# Patient Record
Sex: Male | Born: 1945
Health system: Southern US, Community
[De-identification: ages and names within clinical notes are randomized; demographics above are authoritative.]

## PROBLEM LIST (undated history)

## (undated) DIAGNOSIS — I10 Essential (primary) hypertension: Secondary | ICD-10-CM

## (undated) DIAGNOSIS — E119 Type 2 diabetes mellitus without complications: Secondary | ICD-10-CM

## (undated) HISTORY — PX: NO PAST SURGERIES: SHX2092

---

## 2007-04-19 ENCOUNTER — Emergency Department (HOSPITAL_COMMUNITY): Admission: EM | Admit: 2007-04-19 | Discharge: 2007-04-19 | Payer: Self-pay | Admitting: Family Medicine

## 2007-08-05 ENCOUNTER — Emergency Department (HOSPITAL_COMMUNITY): Admission: EM | Admit: 2007-08-05 | Discharge: 2007-08-05 | Payer: Self-pay | Admitting: Emergency Medicine

## 2011-08-31 DIAGNOSIS — IMO0001 Reserved for inherently not codable concepts without codable children: Secondary | ICD-10-CM | POA: Diagnosis not present

## 2011-08-31 DIAGNOSIS — I10 Essential (primary) hypertension: Secondary | ICD-10-CM | POA: Diagnosis not present

## 2011-08-31 DIAGNOSIS — R634 Abnormal weight loss: Secondary | ICD-10-CM | POA: Diagnosis not present

## 2012-01-02 DIAGNOSIS — E119 Type 2 diabetes mellitus without complications: Secondary | ICD-10-CM | POA: Diagnosis not present

## 2012-01-03 ENCOUNTER — Encounter (INDEPENDENT_AMBULATORY_CARE_PROVIDER_SITE_OTHER): Payer: Medicare Other | Admitting: Ophthalmology

## 2012-01-03 DIAGNOSIS — I10 Essential (primary) hypertension: Secondary | ICD-10-CM

## 2012-01-03 DIAGNOSIS — H33309 Unspecified retinal break, unspecified eye: Secondary | ICD-10-CM | POA: Diagnosis not present

## 2012-01-03 DIAGNOSIS — E11319 Type 2 diabetes mellitus with unspecified diabetic retinopathy without macular edema: Secondary | ICD-10-CM

## 2012-01-03 DIAGNOSIS — H2510 Age-related nuclear cataract, unspecified eye: Secondary | ICD-10-CM

## 2012-01-03 DIAGNOSIS — E1165 Type 2 diabetes mellitus with hyperglycemia: Secondary | ICD-10-CM

## 2012-01-03 DIAGNOSIS — E1139 Type 2 diabetes mellitus with other diabetic ophthalmic complication: Secondary | ICD-10-CM | POA: Diagnosis not present

## 2012-01-03 DIAGNOSIS — H43819 Vitreous degeneration, unspecified eye: Secondary | ICD-10-CM

## 2012-01-03 DIAGNOSIS — H35039 Hypertensive retinopathy, unspecified eye: Secondary | ICD-10-CM

## 2012-01-18 ENCOUNTER — Ambulatory Visit (INDEPENDENT_AMBULATORY_CARE_PROVIDER_SITE_OTHER): Payer: Medicare Other | Admitting: Ophthalmology

## 2012-01-18 DIAGNOSIS — E11319 Type 2 diabetes mellitus with unspecified diabetic retinopathy without macular edema: Secondary | ICD-10-CM

## 2012-01-18 DIAGNOSIS — E1165 Type 2 diabetes mellitus with hyperglycemia: Secondary | ICD-10-CM | POA: Diagnosis not present

## 2012-01-18 DIAGNOSIS — H33309 Unspecified retinal break, unspecified eye: Secondary | ICD-10-CM | POA: Diagnosis not present

## 2012-01-18 DIAGNOSIS — E1139 Type 2 diabetes mellitus with other diabetic ophthalmic complication: Secondary | ICD-10-CM

## 2012-02-27 DIAGNOSIS — E7800 Pure hypercholesterolemia, unspecified: Secondary | ICD-10-CM | POA: Diagnosis not present

## 2012-02-27 DIAGNOSIS — IMO0001 Reserved for inherently not codable concepts without codable children: Secondary | ICD-10-CM | POA: Diagnosis not present

## 2012-03-03 DIAGNOSIS — Z23 Encounter for immunization: Secondary | ICD-10-CM | POA: Diagnosis not present

## 2012-03-03 DIAGNOSIS — I10 Essential (primary) hypertension: Secondary | ICD-10-CM | POA: Diagnosis not present

## 2012-03-03 DIAGNOSIS — E7800 Pure hypercholesterolemia, unspecified: Secondary | ICD-10-CM | POA: Diagnosis not present

## 2012-03-03 DIAGNOSIS — IMO0001 Reserved for inherently not codable concepts without codable children: Secondary | ICD-10-CM | POA: Diagnosis not present

## 2012-05-19 ENCOUNTER — Ambulatory Visit (INDEPENDENT_AMBULATORY_CARE_PROVIDER_SITE_OTHER): Payer: Medicare Other | Admitting: Ophthalmology

## 2012-05-29 ENCOUNTER — Ambulatory Visit (INDEPENDENT_AMBULATORY_CARE_PROVIDER_SITE_OTHER): Payer: Medicare Other | Admitting: Ophthalmology

## 2012-05-29 DIAGNOSIS — H43819 Vitreous degeneration, unspecified eye: Secondary | ICD-10-CM

## 2012-05-29 DIAGNOSIS — E1165 Type 2 diabetes mellitus with hyperglycemia: Secondary | ICD-10-CM

## 2012-05-29 DIAGNOSIS — E1139 Type 2 diabetes mellitus with other diabetic ophthalmic complication: Secondary | ICD-10-CM | POA: Diagnosis not present

## 2012-05-29 DIAGNOSIS — H2510 Age-related nuclear cataract, unspecified eye: Secondary | ICD-10-CM

## 2012-05-29 DIAGNOSIS — I10 Essential (primary) hypertension: Secondary | ICD-10-CM

## 2012-05-29 DIAGNOSIS — H33309 Unspecified retinal break, unspecified eye: Secondary | ICD-10-CM | POA: Diagnosis not present

## 2012-05-29 DIAGNOSIS — E11319 Type 2 diabetes mellitus with unspecified diabetic retinopathy without macular edema: Secondary | ICD-10-CM

## 2012-05-29 DIAGNOSIS — H35039 Hypertensive retinopathy, unspecified eye: Secondary | ICD-10-CM

## 2012-09-01 DIAGNOSIS — E7800 Pure hypercholesterolemia, unspecified: Secondary | ICD-10-CM | POA: Diagnosis not present

## 2012-09-01 DIAGNOSIS — I10 Essential (primary) hypertension: Secondary | ICD-10-CM | POA: Diagnosis not present

## 2012-09-01 DIAGNOSIS — IMO0001 Reserved for inherently not codable concepts without codable children: Secondary | ICD-10-CM | POA: Diagnosis not present

## 2013-01-02 DIAGNOSIS — E119 Type 2 diabetes mellitus without complications: Secondary | ICD-10-CM | POA: Diagnosis not present

## 2013-02-20 DIAGNOSIS — Z23 Encounter for immunization: Secondary | ICD-10-CM | POA: Diagnosis not present

## 2013-03-04 DIAGNOSIS — I10 Essential (primary) hypertension: Secondary | ICD-10-CM | POA: Diagnosis not present

## 2013-03-04 DIAGNOSIS — IMO0001 Reserved for inherently not codable concepts without codable children: Secondary | ICD-10-CM | POA: Diagnosis not present

## 2013-03-04 DIAGNOSIS — E7800 Pure hypercholesterolemia, unspecified: Secondary | ICD-10-CM | POA: Diagnosis not present

## 2013-03-21 ENCOUNTER — Emergency Department (HOSPITAL_COMMUNITY): Payer: Medicare Other

## 2013-03-21 ENCOUNTER — Encounter (HOSPITAL_COMMUNITY): Payer: Self-pay | Admitting: Emergency Medicine

## 2013-03-21 ENCOUNTER — Emergency Department (HOSPITAL_COMMUNITY)
Admission: EM | Admit: 2013-03-21 | Discharge: 2013-03-21 | Disposition: A | Discharge status: Home / Self Care | Payer: Medicare Other | Attending: Emergency Medicine | Admitting: Emergency Medicine

## 2013-03-21 DIAGNOSIS — R05 Cough: Secondary | ICD-10-CM | POA: Diagnosis not present

## 2013-03-21 DIAGNOSIS — R509 Fever, unspecified: Secondary | ICD-10-CM | POA: Diagnosis not present

## 2013-03-21 DIAGNOSIS — N390 Urinary tract infection, site not specified: Secondary | ICD-10-CM | POA: Diagnosis not present

## 2013-03-21 DIAGNOSIS — R059 Cough, unspecified: Secondary | ICD-10-CM | POA: Diagnosis not present

## 2013-03-21 DIAGNOSIS — Z79899 Other long term (current) drug therapy: Secondary | ICD-10-CM | POA: Insufficient documentation

## 2013-03-21 LAB — URINE MICROSCOPIC-ADD ON

## 2013-03-21 LAB — URINALYSIS, ROUTINE W REFLEX MICROSCOPIC
Bilirubin Urine: NEGATIVE
Glucose, UA: NEGATIVE mg/dL
Specific Gravity, Urine: 1.022 (ref 1.005–1.030)
pH: 7.5 (ref 5.0–8.0)

## 2013-03-21 MED ORDER — CIPROFLOXACIN HCL 500 MG PO TABS: 500.0000 mg | ORAL_TABLET | Freq: Two times a day (BID) | ORAL | Status: DC

## 2013-03-21 NOTE — ED Notes (Signed)
Patient discharged to home with family. NAD.  

## 2013-03-21 NOTE — ED Provider Notes (Signed)
Medical screening examination/treatment/procedure(s) were performed by non-physician practitioner and as supervising physician I was immediately available for consultation/collaboration.  EKG Interpretation   None         Briyanna Billingham B. Bernette Mayers, MD 03/21/13 [phone]

## 2013-03-21 NOTE — ED Notes (Signed)
Patient presents to ED today with complaints of fever for the past 4 days and lower back pain and headache with fever. Patient reports difficulty urinating for 4-5 days.

## 2013-03-21 NOTE — ED Provider Notes (Signed)
CSN: [id number]     Arrival date & time 03/21/13  0800 History   None    No chief complaint on file.  (Consider location/radiation/quality/duration/timing/severity/associated sxs/prior Treatment) Patient is a 67 y.o. male presenting with fever. The history is provided by the patient. No language interpreter was used.  Fever Max temp prior to arrival:  100 Temp source:  Subjective Severity:  Moderate Onset quality:  Gradual Duration:  4 days Timing:  Constant Progression:  Worsening Relieved by:  Nothing Worsened by:  Nothing tried Ineffective treatments:  None tried Associated symptoms: no chest pain and no nausea     No past medical history on file. No past surgical history on file. No family history on file. History  Substance Use Topics  . Smoking status: Not on file  . Smokeless tobacco: Not on file  . Alcohol Use: Not on file    Review of Systems  Constitutional: Positive for fever.  Cardiovascular: Negative for chest pain.  Gastrointestinal: Negative for nausea.    Allergies  Review of patient's allergies indicates no known allergies.  Home Medications   Current Outpatient Rx  Name  Route  Sig  Dispense  Refill  . acetaminophen (TYLENOL) 500 MG tablet   Oral   Take 500 mg by mouth every 6 (six) hours as needed for fever.         . valsartan-hydrochlorothiazide (DIOVAN-HCT) 160-25 MG per tablet   Oral   Take 1 tablet by mouth daily.          There were no vitals taken for this visit. Physical Exam  Vitals reviewed. Constitutional: He is oriented to person, place, and time. He appears well-developed.  HENT:  Head: Normocephalic and atraumatic.  Right Ear: External ear normal.  Left Ear: External ear normal.  Eyes: Pupils are equal, round, and reactive to light.  Neck: Normal range of motion.  Cardiovascular: Normal rate.   Pulmonary/Chest: Effort normal and breath sounds normal.  Abdominal: Soft.  Musculoskeletal: Normal range of motion.   Neurological: He is alert and oriented to person, place, and time. He has normal reflexes.  Skin: Skin is warm.  Psychiatric: He has a normal mood and affect.    ED Course  Procedures (including critical care time) Labs Review Labs Reviewed - No data to display Imaging Review No results found.  EKG Interpretation   None      Results for orders placed during the hospital encounter of 03/21/13  URINALYSIS, ROUTINE W REFLEX MICROSCOPIC      Result Value Range   Color, Urine YELLOW  YELLOW   APPearance CLOUDY (*) CLEAR   Specific Gravity, Urine 1.022  1.005 - 1.030   pH 7.5  5.0 - 8.0   Glucose, UA NEGATIVE  NEGATIVE mg/dL   Hgb urine dipstick TRACE (*) NEGATIVE   Bilirubin Urine NEGATIVE  NEGATIVE   Ketones, ur 15 (*) NEGATIVE mg/dL   Protein, ur 409 (*) NEGATIVE mg/dL   Urobilinogen, UA 1.0  0.0 - 1.0 mg/dL   Nitrite POSITIVE (*) NEGATIVE   Leukocytes, UA SMALL (*) NEGATIVE  URINE MICROSCOPIC-ADD ON      Result Value Range   Squamous Epithelial / LPF RARE  RARE   WBC, UA 7-10  <3 WBC/hpf   RBC / HPF 0-2  <3 RBC/hpf   Bacteria, UA MANY (*) RARE   Dg Chest 2 View  03/21/2013   CLINICAL DATA:  Fever for 5 days.  Cough.  EXAM: CHEST  2 VIEW  COMPARISON:  None.  FINDINGS: The heart size and mediastinal contours are within normal limits. Both lungs are clear. The visualized skeletal structures are unremarkable.  IMPRESSION: No active cardiopulmonary disease.   Electronically Signed   By: Amie Portland M.D.   On: 03/21/2013 09:25     MDM  No diagnosis found. Results for orders placed during the hospital encounter of 03/21/13  URINALYSIS, ROUTINE W REFLEX MICROSCOPIC      Result Value Range   Color, Urine YELLOW  YELLOW   APPearance CLOUDY (*) CLEAR   Specific Gravity, Urine 1.022  1.005 - 1.030   pH 7.5  5.0 - 8.0   Glucose, UA NEGATIVE  NEGATIVE mg/dL   Hgb urine dipstick TRACE (*) NEGATIVE   Bilirubin Urine NEGATIVE  NEGATIVE   Ketones, ur 15 (*) NEGATIVE mg/dL    Protein, ur 161 (*) NEGATIVE mg/dL   Urobilinogen, UA 1.0  0.0 - 1.0 mg/dL   Nitrite POSITIVE (*) NEGATIVE   Leukocytes, UA SMALL (*) NEGATIVE  URINE MICROSCOPIC-ADD ON      Result Value Range   Squamous Epithelial / LPF RARE  RARE   WBC, UA 7-10  <3 WBC/hpf   RBC / HPF 0-2  <3 RBC/hpf   Bacteria, UA MANY (*) RARE   Dg Chest 2 View  03/21/2013   CLINICAL DATA:  Fever for 5 days.  Cough.  EXAM: CHEST  2 VIEW  COMPARISON:  None.  FINDINGS: The heart size and mediastinal contours are within normal limits. Both lungs are clear. The visualized skeletal structures are unremarkable.  IMPRESSION: No active cardiopulmonary disease.   Electronically Signed   By: Amie Portland M.D.   On: 03/21/2013 09:25   Pt given rx for cipro    Elson Areas, PA-C 03/21/13 [phone]

## 2013-03-23 LAB — URINE CULTURE

## 2013-04-20 DIAGNOSIS — E7800 Pure hypercholesterolemia, unspecified: Secondary | ICD-10-CM | POA: Diagnosis not present

## 2013-04-20 DIAGNOSIS — N183 Chronic kidney disease, stage 3 unspecified: Secondary | ICD-10-CM | POA: Diagnosis not present

## 2013-04-20 DIAGNOSIS — E1129 Type 2 diabetes mellitus with other diabetic kidney complication: Secondary | ICD-10-CM | POA: Diagnosis not present

## 2013-04-20 DIAGNOSIS — R55 Syncope and collapse: Secondary | ICD-10-CM | POA: Diagnosis not present

## 2013-04-20 DIAGNOSIS — I10 Essential (primary) hypertension: Secondary | ICD-10-CM | POA: Diagnosis not present

## 2013-05-03 ENCOUNTER — Encounter (HOSPITAL_COMMUNITY): Payer: Self-pay | Admitting: Emergency Medicine

## 2013-05-03 ENCOUNTER — Emergency Department (HOSPITAL_COMMUNITY)
Admission: EM | Admit: 2013-05-03 | Discharge: 2013-05-03 | Disposition: A | Discharge status: Home / Self Care | Payer: Medicare Other | Attending: Emergency Medicine | Admitting: Emergency Medicine

## 2013-05-03 ENCOUNTER — Emergency Department (HOSPITAL_COMMUNITY): Payer: Medicare Other

## 2013-05-03 DIAGNOSIS — R9431 Abnormal electrocardiogram [ECG] [EKG]: Secondary | ICD-10-CM | POA: Insufficient documentation

## 2013-05-03 DIAGNOSIS — R55 Syncope and collapse: Secondary | ICD-10-CM | POA: Insufficient documentation

## 2013-05-03 DIAGNOSIS — E119 Type 2 diabetes mellitus without complications: Secondary | ICD-10-CM | POA: Diagnosis not present

## 2013-05-03 DIAGNOSIS — I10 Essential (primary) hypertension: Secondary | ICD-10-CM | POA: Insufficient documentation

## 2013-05-03 DIAGNOSIS — Z87891 Personal history of nicotine dependence: Secondary | ICD-10-CM | POA: Diagnosis not present

## 2013-05-03 DIAGNOSIS — I4510 Unspecified right bundle-branch block: Secondary | ICD-10-CM | POA: Insufficient documentation

## 2013-05-03 DIAGNOSIS — R42 Dizziness and giddiness: Secondary | ICD-10-CM | POA: Diagnosis not present

## 2013-05-03 DIAGNOSIS — Z79899 Other long term (current) drug therapy: Secondary | ICD-10-CM | POA: Insufficient documentation

## 2013-05-03 HISTORY — DX: Essential (primary) hypertension: I10

## 2013-05-03 HISTORY — DX: Type 2 diabetes mellitus without complications: E11.9

## 2013-05-03 LAB — CBC WITH DIFFERENTIAL/PLATELET
Eosinophils Absolute: 0.2 10*3/uL (ref 0.0–0.7)
HCT: 36.6 % — ABNORMAL LOW (ref 39.0–52.0)
Hemoglobin: 12.5 g/dL — ABNORMAL LOW (ref 13.0–17.0)
Lymphocytes Relative: 32 % (ref 12–46)
Lymphs Abs: 1.6 10*3/uL (ref 0.7–4.0)
MCH: 28.7 pg (ref 26.0–34.0)
MCV: 83.9 fL (ref 78.0–100.0)
Monocytes Absolute: 0.3 10*3/uL (ref 0.1–1.0)
Monocytes Relative: 5 % (ref 3–12)
Neutrophils Relative %: 58 % (ref 43–77)
RBC: 4.36 MIL/uL (ref 4.22–5.81)
WBC: 4.9 10*3/uL (ref 4.0–10.5)

## 2013-05-03 LAB — COMPREHENSIVE METABOLIC PANEL
Albumin: 3.5 g/dL (ref 3.5–5.2)
Alkaline Phosphatase: 61 U/L (ref 39–117)
BUN: 15 mg/dL (ref 6–23)
Chloride: 101 mEq/L (ref 96–112)
Creatinine, Ser: 0.92 mg/dL (ref 0.50–1.35)
GFR calc Af Amer: >90 mL/min (ref >90)
Glucose, Bld: 268 mg/dL — ABNORMAL HIGH (ref 70–99)
Potassium: 3.5 mEq/L (ref 3.5–5.1)
Total Bilirubin: 0.2 mg/dL — ABNORMAL LOW (ref 0.3–1.2)
Total Protein: 6.8 g/dL (ref 6.0–8.3)

## 2013-05-03 MED ORDER — MECLIZINE HCL 25 MG PO TABS
25.0000 mg | ORAL_TABLET | Freq: Once | ORAL | Status: AC
  Administered 2013-05-03: 25 mg via ORAL
  Filled 2013-05-03: qty 1

## 2013-05-03 MED ORDER — MECLIZINE HCL 25 MG PO TABS: 25.0000 mg | ORAL_TABLET | Freq: Three times a day (TID) | ORAL | Status: DC | PRN

## 2013-05-03 NOTE — ED Provider Notes (Signed)
CSN: [id number]     Arrival date & time 05/03/13  0532 History   First MD Initiated Contact with Patient 05/03/13 [phone]     Chief Complaint  Patient presents with  . Dizziness   (Consider location/radiation/quality/duration/timing/severity/associated sxs/prior Treatment) HPI Comments: Patient is a 67 year old male with past medical history of hypertension. Presents today with complaints of dizziness that he states has been occurring intermittently for the past 3 days. It is worse when he turns his head and worse when he lies flat. It is relieved with rest. He denies nausea, headache, visual changes, chest pain, or palpitations. He states that he had a syncopal episode approximately 2 weeks ago while he was sick with upper respiratory infection and taking an antibiotic. He is unsure if he hit his head or not during this episode.  Patient is a 67 y.o. male presenting with dizziness. The history is provided by the patient.  Dizziness Quality:  Head spinning and imbalance Severity:  Moderate Duration:  3 days Timing:  Intermittent Progression:  Worsening Chronicity:  New Context: bending over   Context comment:  Turning head and laying flat Relieved by:  Being still Worsened by:  Lying down and turning head   Past Medical History  Diagnosis Date  . Diabetes mellitus without complication   . Hypertension    History reviewed. No pertinent past surgical history. History reviewed. No pertinent family history. History  Substance Use Topics  . Smoking status: Former Games developer  . Smokeless tobacco: Not on file  . Alcohol Use: No    Review of Systems  Neurological: Positive for dizziness.  All other systems reviewed and are negative.    Allergies  Review of patient's allergies indicates no known allergies.  Home Medications   Current Outpatient Rx  Name  Route  Sig  Dispense  Refill  . cyclobenzaprine (FLEXERIL) 10 MG tablet   Oral   Take 10 mg by mouth 3 (three) times daily as  needed for muscle spasms.         . valsartan-hydrochlorothiazide (DIOVAN-HCT) 160-25 MG per tablet   Oral   Take 1 tablet by mouth daily.          BP 136/72  Pulse 73  Temp(Src) 98 F (36.7 C) (Oral)  Resp 18  Ht 5\' 7"  (1.702 m)  Wt 142 lb (64.411 kg)  BMI 22.24 kg/m2  SpO2 100% Physical Exam  Nursing note and vitals reviewed. Constitutional: He is oriented to person, place, and time. He appears well-developed and well-nourished. No distress.  HENT:  Head: Normocephalic and atraumatic.  Mouth/Throat: Oropharynx is clear and moist.  Eyes: EOM are normal. Pupils are equal, round, and reactive to light.  Neck: Normal range of motion. Neck supple.  Cardiovascular: Normal rate, regular rhythm and normal heart sounds.   No murmur heard. Pulmonary/Chest: Effort normal and breath sounds normal. No respiratory distress. He has no wheezes.  Abdominal: Soft. Bowel sounds are normal. He exhibits no distension. There is no tenderness.  Musculoskeletal: Normal range of motion. He exhibits no edema.  Lymphadenopathy:    He has no cervical adenopathy.  Neurological: He is alert and oriented to person, place, and time. No cranial nerve deficit. He exhibits normal muscle tone. Coordination normal.  Skin: Skin is warm and dry. He is not diaphoretic.    ED Course  Procedures (including critical care time) Labs Review Labs Reviewed  CBC WITH DIFFERENTIAL  COMPREHENSIVE METABOLIC PANEL   Imaging Review No results found.  EKG Interpretation  Date/Time:  Sunday May 03 2013 05:41:46 EST Ventricular Rate:  80 PR Interval:  150 QRS Duration: 134 QT Interval:  398 QTC Calculation: 459 R Axis:   49 Text Interpretation:  Normal sinus rhythm Possible Left atrial enlargement Right bundle branch block Abnormal ECG Confirmed by DELOS  MD, Katherine Tout ([id number]) on 05/03/2013 9:22:56 AM            MDM  No diagnosis found. Patient is a 67 year old male who presents here with  dizziness for the past several days. It is worse with head movement and position and sounds like peripheral vertigo. CT scan of his head was unremarkable and laboratory studies were unremarkable as well, with the exception of an elevated blood sugar. I've advised him to follow this for the next several days and keep her of his sugars with which she can present his Dr. at a followup appointment. He will be discharged to home with meclizine and advised to return if he develops worsening symptoms.    Geoffery Lyons, MD 05/03/13 [phone]

## 2013-05-03 NOTE — ED Notes (Signed)
Patient reports that he was putting up the tree and it started to fall.  He grabbed it and since then he has been having some dizziness when he moves his head to left or the right

## 2013-05-14 ENCOUNTER — Encounter: Payer: Self-pay | Admitting: Cardiology

## 2013-05-14 ENCOUNTER — Ambulatory Visit (HOSPITAL_COMMUNITY): Payer: Medicare Other | Attending: Cardiology

## 2013-05-14 ENCOUNTER — Ambulatory Visit (HOSPITAL_COMMUNITY): Payer: Medicare Other | Attending: Internal Medicine | Admitting: Radiology

## 2013-05-14 ENCOUNTER — Other Ambulatory Visit (HOSPITAL_COMMUNITY): Payer: Self-pay | Admitting: Internal Medicine

## 2013-05-14 DIAGNOSIS — R55 Syncope and collapse: Secondary | ICD-10-CM

## 2013-05-14 DIAGNOSIS — I10 Essential (primary) hypertension: Secondary | ICD-10-CM | POA: Insufficient documentation

## 2013-05-14 DIAGNOSIS — E119 Type 2 diabetes mellitus without complications: Secondary | ICD-10-CM | POA: Insufficient documentation

## 2013-05-14 DIAGNOSIS — E785 Hyperlipidemia, unspecified: Secondary | ICD-10-CM | POA: Insufficient documentation

## 2013-05-14 DIAGNOSIS — R0989 Other specified symptoms and signs involving the circulatory and respiratory systems: Secondary | ICD-10-CM

## 2013-05-14 NOTE — Progress Notes (Signed)
Stress Echocardiogram performed.  

## 2013-06-04 ENCOUNTER — Ambulatory Visit (INDEPENDENT_AMBULATORY_CARE_PROVIDER_SITE_OTHER): Payer: Medicare Other | Admitting: Ophthalmology

## 2013-06-05 ENCOUNTER — Ambulatory Visit (INDEPENDENT_AMBULATORY_CARE_PROVIDER_SITE_OTHER): Payer: Self-pay | Admitting: Ophthalmology

## 2013-06-19 DIAGNOSIS — E1129 Type 2 diabetes mellitus with other diabetic kidney complication: Secondary | ICD-10-CM | POA: Diagnosis not present

## 2013-06-19 DIAGNOSIS — I10 Essential (primary) hypertension: Secondary | ICD-10-CM | POA: Diagnosis not present

## 2013-06-19 DIAGNOSIS — Z125 Encounter for screening for malignant neoplasm of prostate: Secondary | ICD-10-CM | POA: Diagnosis not present

## 2013-06-19 DIAGNOSIS — E663 Overweight: Secondary | ICD-10-CM | POA: Diagnosis not present

## 2013-06-19 DIAGNOSIS — Z0000 Encounter for general adult medical examination without abnormal findings: Secondary | ICD-10-CM | POA: Diagnosis not present

## 2013-06-26 DIAGNOSIS — N183 Chronic kidney disease, stage 3 unspecified: Secondary | ICD-10-CM | POA: Diagnosis not present

## 2013-06-26 DIAGNOSIS — I10 Essential (primary) hypertension: Secondary | ICD-10-CM | POA: Diagnosis not present

## 2013-06-26 DIAGNOSIS — E1165 Type 2 diabetes mellitus with hyperglycemia: Secondary | ICD-10-CM | POA: Diagnosis not present

## 2013-06-26 DIAGNOSIS — E7800 Pure hypercholesterolemia, unspecified: Secondary | ICD-10-CM | POA: Diagnosis not present

## 2013-06-26 DIAGNOSIS — E1129 Type 2 diabetes mellitus with other diabetic kidney complication: Secondary | ICD-10-CM | POA: Diagnosis not present

## 2013-07-10 ENCOUNTER — Ambulatory Visit (INDEPENDENT_AMBULATORY_CARE_PROVIDER_SITE_OTHER): Payer: Self-pay | Admitting: Ophthalmology

## 2013-07-17 ENCOUNTER — Ambulatory Visit (INDEPENDENT_AMBULATORY_CARE_PROVIDER_SITE_OTHER): Payer: Self-pay | Admitting: Ophthalmology

## 2013-08-07 ENCOUNTER — Ambulatory Visit (INDEPENDENT_AMBULATORY_CARE_PROVIDER_SITE_OTHER): Payer: Medicare Other | Admitting: Ophthalmology

## 2013-08-07 DIAGNOSIS — H2510 Age-related nuclear cataract, unspecified eye: Secondary | ICD-10-CM

## 2013-08-07 DIAGNOSIS — E1139 Type 2 diabetes mellitus with other diabetic ophthalmic complication: Secondary | ICD-10-CM

## 2013-08-07 DIAGNOSIS — E1165 Type 2 diabetes mellitus with hyperglycemia: Secondary | ICD-10-CM | POA: Diagnosis not present

## 2013-08-07 DIAGNOSIS — H35039 Hypertensive retinopathy, unspecified eye: Secondary | ICD-10-CM | POA: Diagnosis not present

## 2013-08-07 DIAGNOSIS — I10 Essential (primary) hypertension: Secondary | ICD-10-CM | POA: Diagnosis not present

## 2013-08-07 DIAGNOSIS — H43819 Vitreous degeneration, unspecified eye: Secondary | ICD-10-CM

## 2013-08-07 DIAGNOSIS — H33309 Unspecified retinal break, unspecified eye: Secondary | ICD-10-CM

## 2013-08-07 DIAGNOSIS — E11319 Type 2 diabetes mellitus with unspecified diabetic retinopathy without macular edema: Secondary | ICD-10-CM | POA: Diagnosis not present

## 2013-09-04 DIAGNOSIS — IMO0001 Reserved for inherently not codable concepts without codable children: Secondary | ICD-10-CM | POA: Diagnosis not present

## 2013-09-04 DIAGNOSIS — E7800 Pure hypercholesterolemia, unspecified: Secondary | ICD-10-CM | POA: Diagnosis not present

## 2013-09-04 DIAGNOSIS — I10 Essential (primary) hypertension: Secondary | ICD-10-CM | POA: Diagnosis not present

## 2013-09-04 DIAGNOSIS — E1129 Type 2 diabetes mellitus with other diabetic kidney complication: Secondary | ICD-10-CM | POA: Diagnosis not present

## 2013-09-25 DIAGNOSIS — I10 Essential (primary) hypertension: Secondary | ICD-10-CM | POA: Diagnosis not present

## 2013-10-16 DIAGNOSIS — E7800 Pure hypercholesterolemia, unspecified: Secondary | ICD-10-CM | POA: Diagnosis not present

## 2013-10-16 DIAGNOSIS — N183 Chronic kidney disease, stage 3 unspecified: Secondary | ICD-10-CM | POA: Diagnosis not present

## 2013-10-16 DIAGNOSIS — I10 Essential (primary) hypertension: Secondary | ICD-10-CM | POA: Diagnosis not present

## 2013-10-16 DIAGNOSIS — E1129 Type 2 diabetes mellitus with other diabetic kidney complication: Secondary | ICD-10-CM | POA: Diagnosis not present

## 2013-10-16 DIAGNOSIS — E1165 Type 2 diabetes mellitus with hyperglycemia: Secondary | ICD-10-CM | POA: Diagnosis not present

## 2014-01-08 DIAGNOSIS — E1129 Type 2 diabetes mellitus with other diabetic kidney complication: Secondary | ICD-10-CM | POA: Diagnosis not present

## 2014-01-08 DIAGNOSIS — E7800 Pure hypercholesterolemia, unspecified: Secondary | ICD-10-CM | POA: Diagnosis not present

## 2014-01-08 DIAGNOSIS — I10 Essential (primary) hypertension: Secondary | ICD-10-CM | POA: Diagnosis not present

## 2014-01-08 DIAGNOSIS — IMO0001 Reserved for inherently not codable concepts without codable children: Secondary | ICD-10-CM | POA: Diagnosis not present

## 2014-01-15 DIAGNOSIS — E119 Type 2 diabetes mellitus without complications: Secondary | ICD-10-CM | POA: Diagnosis not present

## 2014-01-15 DIAGNOSIS — H2510 Age-related nuclear cataract, unspecified eye: Secondary | ICD-10-CM | POA: Diagnosis not present

## 2014-01-22 DIAGNOSIS — Z23 Encounter for immunization: Secondary | ICD-10-CM | POA: Diagnosis not present

## 2014-04-23 IMAGING — CT CT HEAD W/O CM
2 series · 16 of 30 positions shown, 20 images · non-contrast
Comparison: None.

CLINICAL DATA: Dizziness of 3 days duration.

EXAM:
CT HEAD WITHOUT CONTRAST
TECHNIQUE: Contiguous axial images were obtained from the base of the skull
through the vertex without intravenous contrast.

[Series 2: head w/o · axial · non-contrast · 0.47mm/px · z∈[+86,+216]mm · 13 of 32 slices shown, 17 images]
[im 3/32  brain]
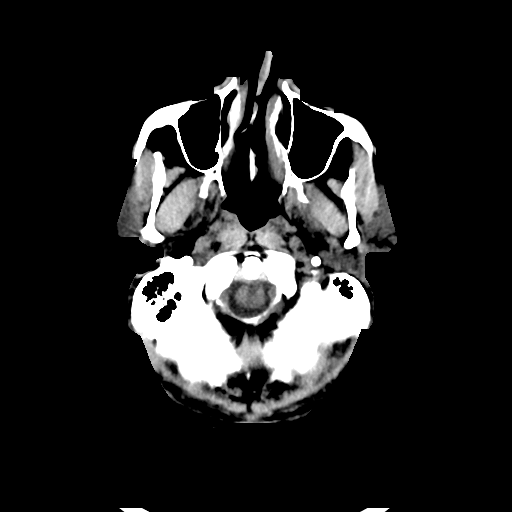
[im 3/32  bone]
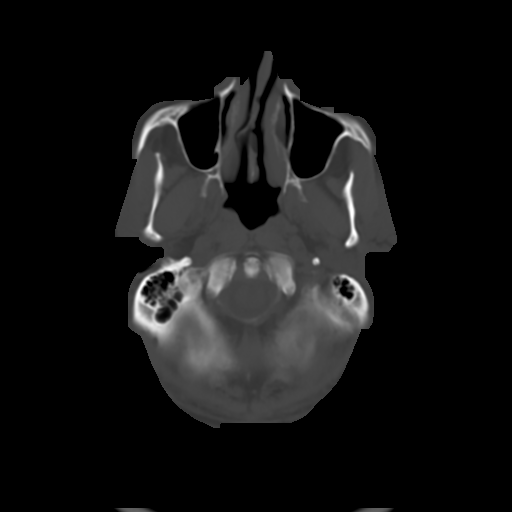
[im 5/32  brain]
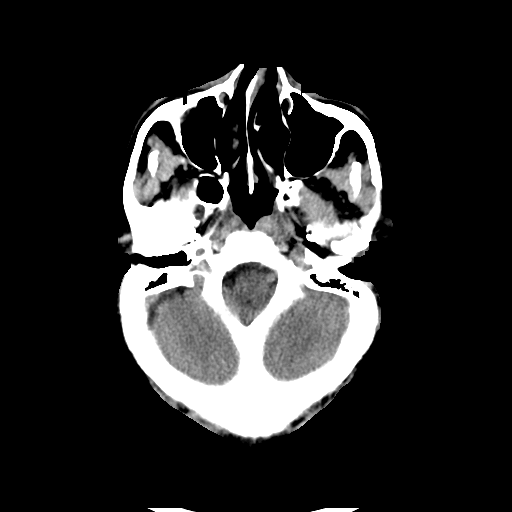
[im 7/32  brain]
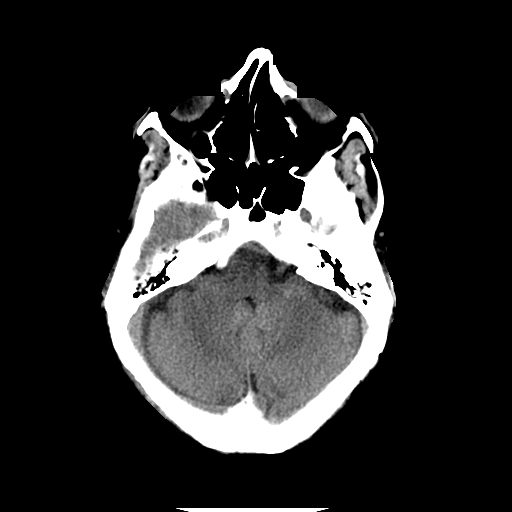
[im 9/32  brain]
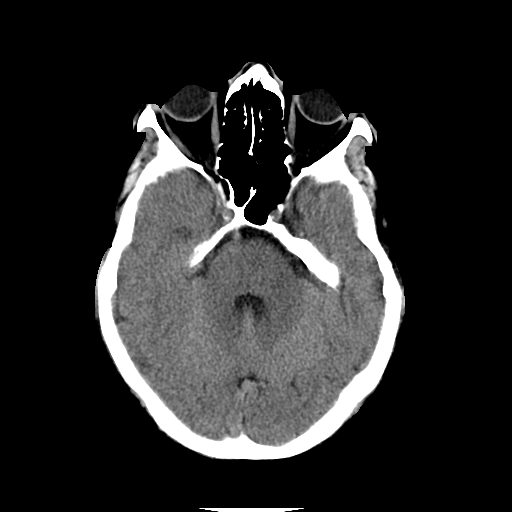
[im 12/32  brain]
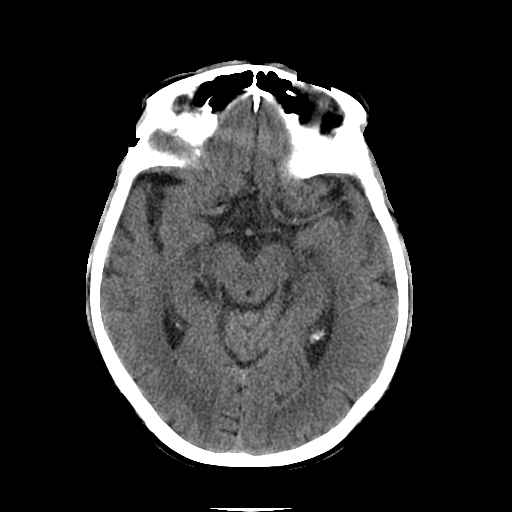
[im 12/32  bone]
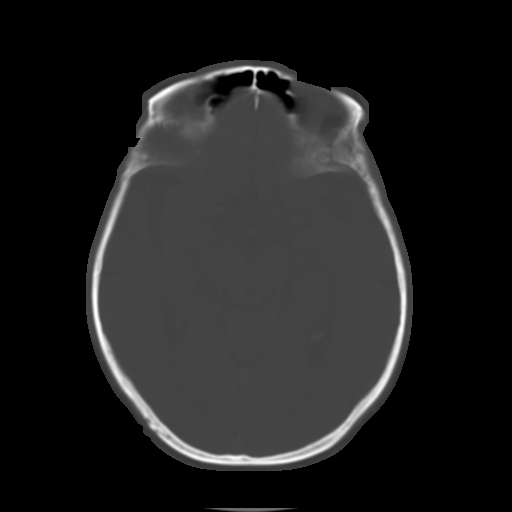
[im 14/32  brain]
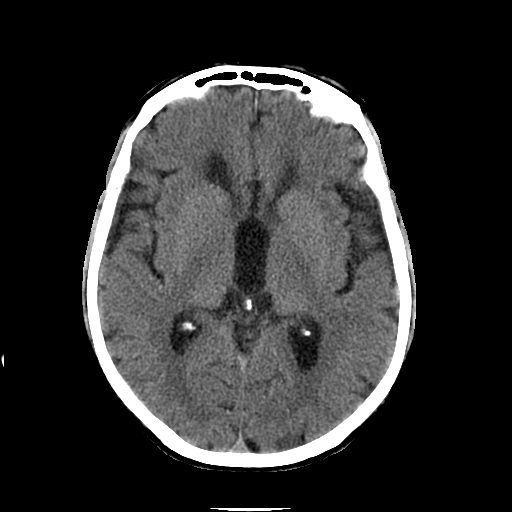
[im 16/32  brain]
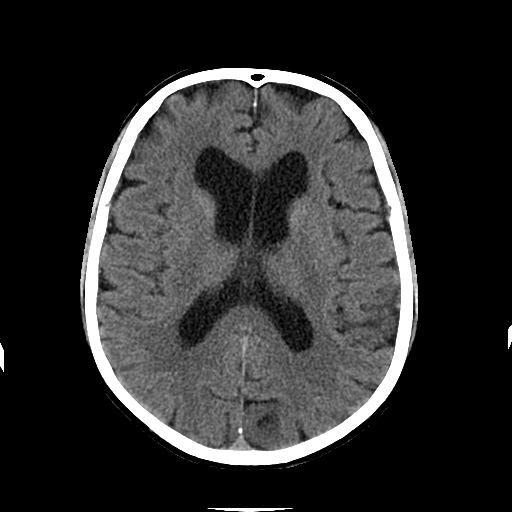
[im 18/32  brain]
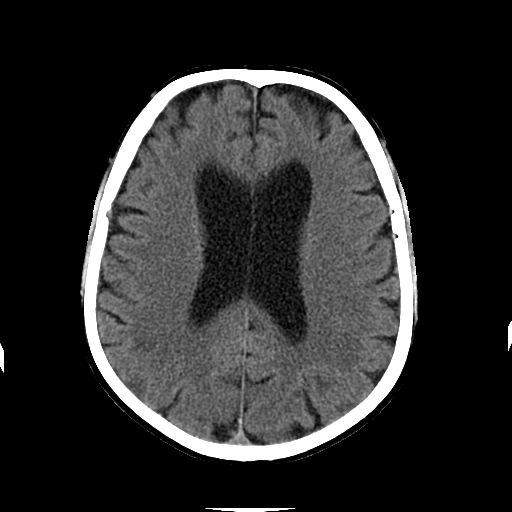
[im 20/32  brain]
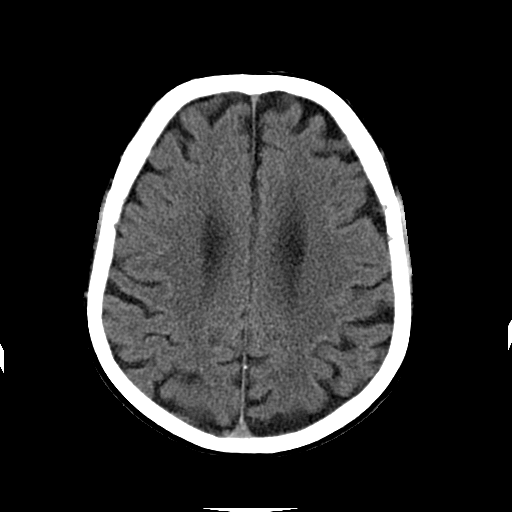
[im 20/32  bone]
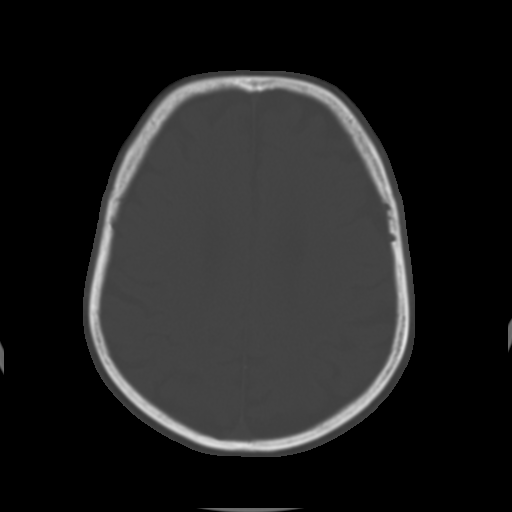
[im 23/32  brain]
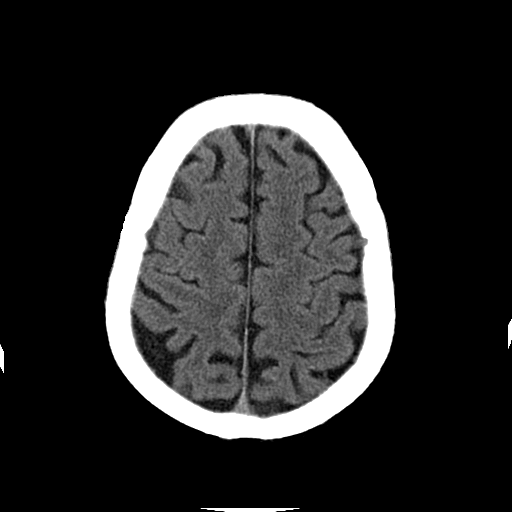
[im 25/32  brain]
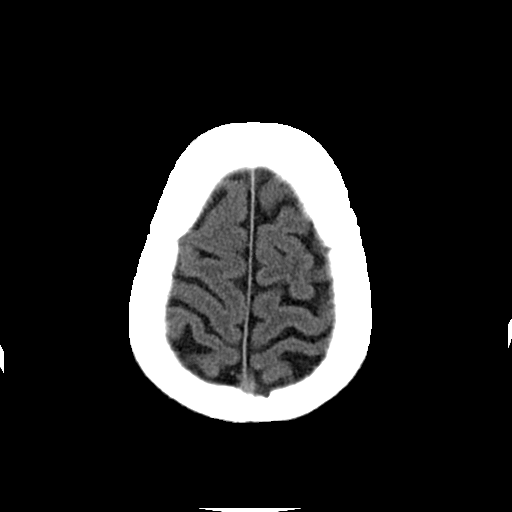
[im 27/32  brain]
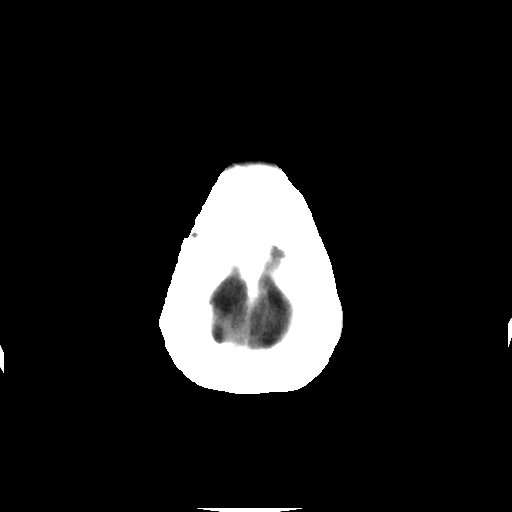
[im 29/32  brain]
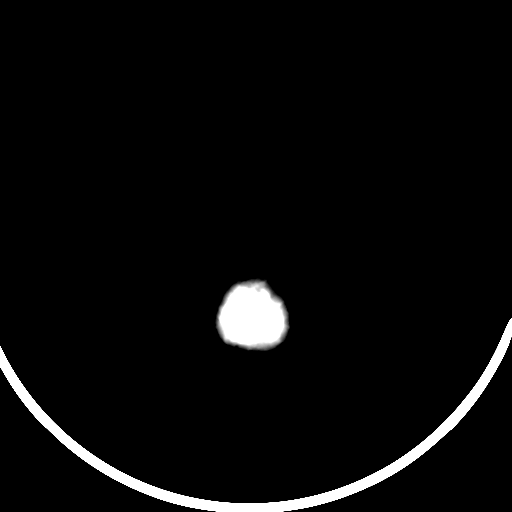
[im 29/32  bone]
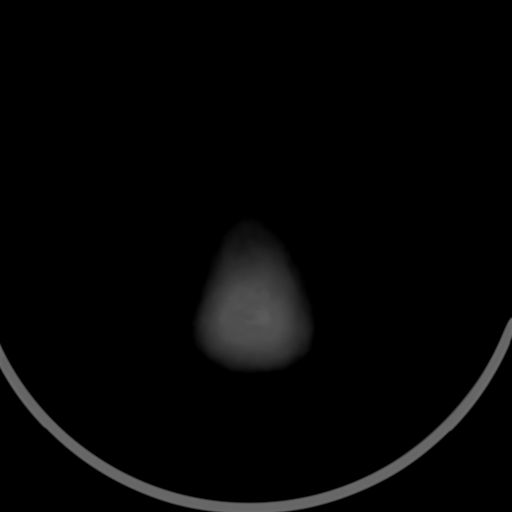

[Series 3: head w/o bone · axial · non-contrast · 0.47mm/px · z∈[+86,+131]mm · 3 of 32 slices shown]
[im 3/32  bone]
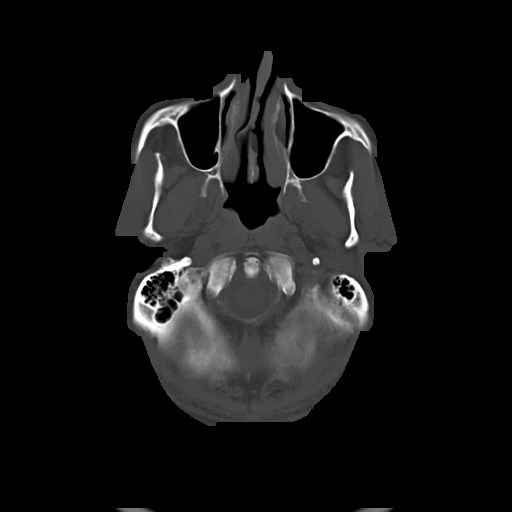
[im 7/32  bone]
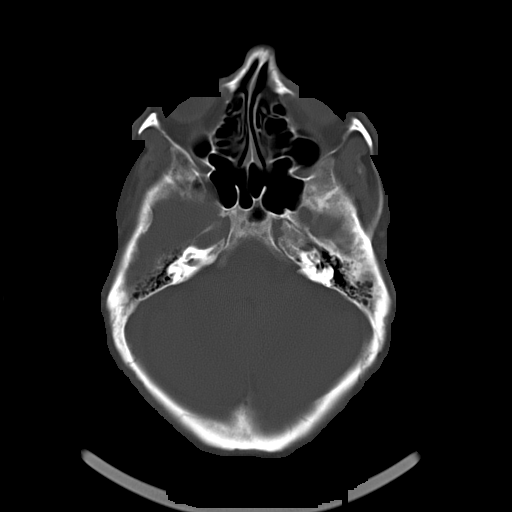
[im 12/32  bone]
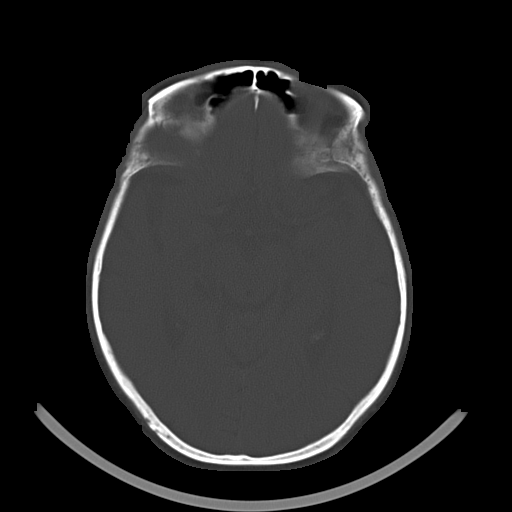

[16 of 30 positions shown; findings below may reference images not displayed]

FINDINGS: The brain shows mild generalized atrophy. There is no evidence of
old or acute infarction, mass lesion, hemorrhage, hydrocephalus or
extra-axial collection. No calvarial abnormality. Sinuses, middle
ears and mastoids are clear.
IMPRESSION: Mild generalized atrophy.  No focal or acute finding.

## 2014-05-26 DIAGNOSIS — I10 Essential (primary) hypertension: Secondary | ICD-10-CM | POA: Diagnosis not present

## 2014-05-26 DIAGNOSIS — E1165 Type 2 diabetes mellitus with hyperglycemia: Secondary | ICD-10-CM | POA: Diagnosis not present

## 2014-05-26 DIAGNOSIS — E780 Pure hypercholesterolemia: Secondary | ICD-10-CM | POA: Diagnosis not present

## 2014-06-04 DIAGNOSIS — I10 Essential (primary) hypertension: Secondary | ICD-10-CM | POA: Diagnosis not present

## 2014-06-04 DIAGNOSIS — E780 Pure hypercholesterolemia: Secondary | ICD-10-CM | POA: Diagnosis not present

## 2014-06-04 DIAGNOSIS — E1165 Type 2 diabetes mellitus with hyperglycemia: Secondary | ICD-10-CM | POA: Diagnosis not present

## 2014-08-13 ENCOUNTER — Ambulatory Visit (INDEPENDENT_AMBULATORY_CARE_PROVIDER_SITE_OTHER): Payer: Medicare Other | Admitting: Ophthalmology

## 2014-08-13 DIAGNOSIS — I10 Essential (primary) hypertension: Secondary | ICD-10-CM

## 2014-08-13 DIAGNOSIS — H35033 Hypertensive retinopathy, bilateral: Secondary | ICD-10-CM

## 2014-08-13 DIAGNOSIS — H33302 Unspecified retinal break, left eye: Secondary | ICD-10-CM | POA: Diagnosis not present

## 2014-08-13 DIAGNOSIS — H43813 Vitreous degeneration, bilateral: Secondary | ICD-10-CM

## 2014-12-10 DIAGNOSIS — E780 Pure hypercholesterolemia: Secondary | ICD-10-CM | POA: Diagnosis not present

## 2014-12-10 DIAGNOSIS — E1165 Type 2 diabetes mellitus with hyperglycemia: Secondary | ICD-10-CM | POA: Diagnosis not present

## 2015-01-11 DIAGNOSIS — I10 Essential (primary) hypertension: Secondary | ICD-10-CM | POA: Diagnosis not present

## 2015-01-11 DIAGNOSIS — E780 Pure hypercholesterolemia: Secondary | ICD-10-CM | POA: Diagnosis not present

## 2015-01-11 DIAGNOSIS — E1165 Type 2 diabetes mellitus with hyperglycemia: Secondary | ICD-10-CM | POA: Diagnosis not present

## 2015-03-10 DIAGNOSIS — L280 Lichen simplex chronicus: Secondary | ICD-10-CM | POA: Diagnosis not present

## 2015-04-25 DIAGNOSIS — Z23 Encounter for immunization: Secondary | ICD-10-CM | POA: Diagnosis not present

## 2015-06-02 DIAGNOSIS — Z23 Encounter for immunization: Secondary | ICD-10-CM | POA: Diagnosis not present

## 2015-07-04 DIAGNOSIS — E7800 Pure hypercholesterolemia, unspecified: Secondary | ICD-10-CM | POA: Diagnosis not present

## 2015-07-04 DIAGNOSIS — E1165 Type 2 diabetes mellitus with hyperglycemia: Secondary | ICD-10-CM | POA: Diagnosis not present

## 2015-07-11 DIAGNOSIS — E1165 Type 2 diabetes mellitus with hyperglycemia: Secondary | ICD-10-CM | POA: Diagnosis not present

## 2015-07-11 DIAGNOSIS — E7800 Pure hypercholesterolemia, unspecified: Secondary | ICD-10-CM | POA: Diagnosis not present

## 2015-07-11 DIAGNOSIS — I10 Essential (primary) hypertension: Secondary | ICD-10-CM | POA: Diagnosis not present

## 2015-07-19 DIAGNOSIS — J309 Allergic rhinitis, unspecified: Secondary | ICD-10-CM | POA: Diagnosis not present

## 2015-07-20 DIAGNOSIS — J309 Allergic rhinitis, unspecified: Secondary | ICD-10-CM | POA: Diagnosis not present

## 2015-07-20 DIAGNOSIS — R05 Cough: Secondary | ICD-10-CM | POA: Diagnosis not present

## 2015-08-19 ENCOUNTER — Ambulatory Visit (INDEPENDENT_AMBULATORY_CARE_PROVIDER_SITE_OTHER): Payer: Medicare Other | Admitting: Ophthalmology

## 2015-09-15 ENCOUNTER — Ambulatory Visit (INDEPENDENT_AMBULATORY_CARE_PROVIDER_SITE_OTHER): Payer: Medicare Other | Admitting: Ophthalmology

## 2015-09-29 ENCOUNTER — Ambulatory Visit (INDEPENDENT_AMBULATORY_CARE_PROVIDER_SITE_OTHER): Payer: Medicare Other | Admitting: Ophthalmology

## 2015-09-29 DIAGNOSIS — H33302 Unspecified retinal break, left eye: Secondary | ICD-10-CM

## 2015-09-29 DIAGNOSIS — H43813 Vitreous degeneration, bilateral: Secondary | ICD-10-CM | POA: Diagnosis not present

## 2015-09-29 DIAGNOSIS — H2513 Age-related nuclear cataract, bilateral: Secondary | ICD-10-CM | POA: Diagnosis not present

## 2015-09-29 DIAGNOSIS — H35033 Hypertensive retinopathy, bilateral: Secondary | ICD-10-CM

## 2015-09-29 DIAGNOSIS — I10 Essential (primary) hypertension: Secondary | ICD-10-CM | POA: Diagnosis not present

## 2016-01-11 DIAGNOSIS — E1165 Type 2 diabetes mellitus with hyperglycemia: Secondary | ICD-10-CM | POA: Diagnosis not present

## 2016-01-11 DIAGNOSIS — E785 Hyperlipidemia, unspecified: Secondary | ICD-10-CM | POA: Diagnosis not present

## 2016-01-12 DIAGNOSIS — N182 Chronic kidney disease, stage 2 (mild): Secondary | ICD-10-CM | POA: Diagnosis not present

## 2016-01-12 DIAGNOSIS — I129 Hypertensive chronic kidney disease with stage 1 through stage 4 chronic kidney disease, or unspecified chronic kidney disease: Secondary | ICD-10-CM | POA: Diagnosis not present

## 2016-01-12 DIAGNOSIS — E1165 Type 2 diabetes mellitus with hyperglycemia: Secondary | ICD-10-CM | POA: Diagnosis not present

## 2016-01-12 DIAGNOSIS — E785 Hyperlipidemia, unspecified: Secondary | ICD-10-CM | POA: Diagnosis not present

## 2016-02-17 DIAGNOSIS — Z23 Encounter for immunization: Secondary | ICD-10-CM | POA: Diagnosis not present

## 2016-07-04 DIAGNOSIS — E785 Hyperlipidemia, unspecified: Secondary | ICD-10-CM | POA: Diagnosis not present

## 2016-07-04 DIAGNOSIS — E1165 Type 2 diabetes mellitus with hyperglycemia: Secondary | ICD-10-CM | POA: Diagnosis not present

## 2016-07-11 DIAGNOSIS — J302 Other seasonal allergic rhinitis: Secondary | ICD-10-CM | POA: Diagnosis not present

## 2016-07-11 DIAGNOSIS — I129 Hypertensive chronic kidney disease with stage 1 through stage 4 chronic kidney disease, or unspecified chronic kidney disease: Secondary | ICD-10-CM | POA: Diagnosis not present

## 2016-07-11 DIAGNOSIS — E1165 Type 2 diabetes mellitus with hyperglycemia: Secondary | ICD-10-CM | POA: Diagnosis not present

## 2016-07-11 DIAGNOSIS — E785 Hyperlipidemia, unspecified: Secondary | ICD-10-CM | POA: Diagnosis not present

## 2016-10-04 ENCOUNTER — Ambulatory Visit (INDEPENDENT_AMBULATORY_CARE_PROVIDER_SITE_OTHER): Payer: Medicare Other | Admitting: Ophthalmology

## 2017-01-09 DIAGNOSIS — E785 Hyperlipidemia, unspecified: Secondary | ICD-10-CM | POA: Diagnosis not present

## 2017-01-09 DIAGNOSIS — E1165 Type 2 diabetes mellitus with hyperglycemia: Secondary | ICD-10-CM | POA: Diagnosis not present

## 2017-01-16 DIAGNOSIS — N182 Chronic kidney disease, stage 2 (mild): Secondary | ICD-10-CM | POA: Diagnosis not present

## 2017-01-16 DIAGNOSIS — E1165 Type 2 diabetes mellitus with hyperglycemia: Secondary | ICD-10-CM | POA: Diagnosis not present

## 2017-01-16 DIAGNOSIS — I129 Hypertensive chronic kidney disease with stage 1 through stage 4 chronic kidney disease, or unspecified chronic kidney disease: Secondary | ICD-10-CM | POA: Diagnosis not present

## 2017-01-16 DIAGNOSIS — E1122 Type 2 diabetes mellitus with diabetic chronic kidney disease: Secondary | ICD-10-CM | POA: Diagnosis not present

## 2017-01-24 ENCOUNTER — Emergency Department (HOSPITAL_COMMUNITY): Payer: Medicare Other

## 2017-01-24 ENCOUNTER — Encounter (HOSPITAL_COMMUNITY): Payer: Self-pay | Admitting: Emergency Medicine

## 2017-01-24 ENCOUNTER — Emergency Department (HOSPITAL_COMMUNITY)
Admission: EM | Admit: 2017-01-24 | Discharge: 2017-01-24 | Disposition: A | Discharge status: Home / Self Care | Payer: Medicare Other | Attending: Emergency Medicine | Admitting: Emergency Medicine

## 2017-01-24 DIAGNOSIS — Z79899 Other long term (current) drug therapy: Secondary | ICD-10-CM | POA: Insufficient documentation

## 2017-01-24 DIAGNOSIS — R0981 Nasal congestion: Secondary | ICD-10-CM | POA: Diagnosis not present

## 2017-01-24 DIAGNOSIS — E119 Type 2 diabetes mellitus without complications: Secondary | ICD-10-CM | POA: Diagnosis not present

## 2017-01-24 DIAGNOSIS — Z87891 Personal history of nicotine dependence: Secondary | ICD-10-CM | POA: Diagnosis not present

## 2017-01-24 DIAGNOSIS — I10 Essential (primary) hypertension: Secondary | ICD-10-CM | POA: Insufficient documentation

## 2017-01-24 DIAGNOSIS — H9201 Otalgia, right ear: Secondary | ICD-10-CM | POA: Diagnosis not present

## 2017-01-24 MED ORDER — CETIRIZINE HCL 10 MG PO TBDP: 10.0000 mg | ORAL_TABLET | Freq: Every day | ORAL | 0 refills | Status: DC

## 2017-01-24 MED ORDER — FLUTICASONE PROPIONATE 50 MCG/ACT NA SUSP: 2.0000 | Freq: Every day | NASAL | 0 refills | Status: DC

## 2017-01-24 MED ORDER — AMOXICILLIN 500 MG PO CAPS: 500.0000 mg | ORAL_CAPSULE | Freq: Two times a day (BID) | ORAL | 0 refills | Status: DC

## 2017-01-24 NOTE — Discharge Instructions (Signed)
Please read and follow all provided instructions.  Your diagnoses today include:  1. Right ear pain     Tests performed today include: Vital signs. See below for your results today.  CT scan  Medications prescribed:  Amoxicillin. Please take all of your antibiotics until finished!   You may develop abdominal discomfort or diarrhea from the antibiotic.  You may help offset this with probiotics which you can buy or get in yogurt. Do not eat or take the probiotics until 2 hours after your antibiotic. Do not take your medicine if develop an itchy rash, swelling in your mouth or lips, or difficulty breathing.  Cetirizine: This is an allergy medication the last help you with your sneezing and nasal symptoms. Please take daily until he follows with your PCP who can recommend whether to continue the medication or stop. Flonase:his is an allergy medication the last help you with your sneezing and nasal symptoms. The medication is a nasal steroid. Please use as directed. Your not noticed symptom changes until at least one week of use. Please take daily until he follows with your PCP who can recommend whether to continue the medication or stop.  Home care instructions:  Follow any educational materials contained in this packet.  Follow-up instructions: Please follow-up with your primary care provider this week for reevaluation. Please discuss with your provider at that time whether or not further images are needed.   Return instructions:  Please return to the Emergency Department if you do not get better, if you get worse, or new symptoms OR Redness, swelling or drainage from the site of pain. Fever greater than 100.4 Changes in hearing Drainage from your ear  - Bleeding that does not stop with holding pressure to the area    -Severe pain (please note that you may be more sore the day after your accident)  - Chest Pain  - Difficulty breathing  - Severe nausea or vomiting  - Inability to tolerate  food and liquids  - Passing out  - Skin becoming red around your wounds  - Change in mental status (confusion or lethargy)  - New numbness or weakness    Please return if you have any other emergent concerns.  Additional Information:  Your vital signs today were: Pulse 80    Temp 98 F (36.7 C) (Oral)    Resp 16    Ht 6' (1.829 m)    Wt 70.8 kg (156 lb)    SpO2 100%    BMI 21.16 kg/m  If your blood pressure (BP) was elevated above 135/85 this visit, please have this repeated by your doctor within one month.

## 2017-01-24 NOTE — ED Triage Notes (Signed)
Patient not in lobby when called for room. 

## 2017-01-24 NOTE — ED Triage Notes (Signed)
Pt presents to ED for right ear pain, worse at night, x 3 weeks.  States today he now has nasal drainage that won't stop.  Denies SOB.  NAD at triage.

## 2017-01-24 NOTE — ED Notes (Signed)
Called 3x no response 

## 2017-01-24 NOTE — ED Notes (Signed)
Pt to CT at this time.

## 2017-01-24 NOTE — ED Provider Notes (Signed)
[facility]-EMERGENCY DEPT Provider Note   CSN: [id number] Arrival date & time: 01/24/17  1632     History   Chief Complaint Chief Complaint  Patient presents with  . Otalgia  . URI    HPI Paul Roberts is a 71 y.o. male with a past medical history significant for diabetes and hypertension who presents to the department today for right ear pain 3 weeks. The patient states that he has gradual onset of pain over the mastoid bone 3 weeks ago. No prior AOM, OE, or other ear problems before onset. The patient states that pain has been intermittent and describes as a shocking sensation over the area. He states that it is worse at night when he lays down on the side. He has not taken anything for this. This morning when he awoke the patient notes he had congestion, rhinorrhea and couldn't stop sneezing. He denies fever, chills, weight lose, HA, visual changes, tinnitus, hearing loss, ear discharge, ear pressure, skin swelling, redness of skin, sore throat, neck pain, sob, doe, or chest pain.   HPI  Past Medical History:  Diagnosis Date  . Diabetes mellitus without complication (HCC)   . Hypertension     There are no active problems to display for this patient.   History reviewed. No pertinent surgical history.     Home Medications    Prior to Admission medications   Medication Sig Start Date End Date Taking? Authorizing Provider  amoxicillin (AMOXIL) 500 MG capsule Take 1 capsule (500 mg total) by mouth 2 (two) times daily. 01/24/17   Rayya Yagi, Elmer Sow, PA-C  Cetirizine HCl 10 MG TBDP Take 10 mg by mouth daily. 01/24/17   Pamla Pangle, Elmer Sow, PA-C  cyclobenzaprine (FLEXERIL) 10 MG tablet Take 10 mg by mouth 3 (three) times daily as needed for muscle spasms.    [provider]  fluticasone (FLONASE) 50 MCG/ACT nasal spray Place 2 sprays into both nostrils daily. 01/24/17   Elster Corbello, Elmer Sow, PA-C  meclizine (ANTIVERT) 25 MG tablet Take 1 tablet (25 mg total) by mouth 3 (three)  times daily as needed for dizziness. 05/03/13   Geoffery Lyons, MD  valsartan-hydrochlorothiazide (DIOVAN-HCT) 160-25 MG per tablet Take 1 tablet by mouth daily.    [provider]    Family History History reviewed. No pertinent family history.  Social History Social History  Substance Use Topics  . Smoking status: Former Games developer  . Smokeless tobacco: Never Used  . Alcohol use No     Allergies   Sulfa antibiotics   Review of Systems Review of Systems  Constitutional: Negative for chills, diaphoresis, fatigue, fever and unexpected weight change.  HENT: Positive for congestion, rhinorrhea, sinus pressure and sneezing. Negative for ear discharge, ear pain, facial swelling, postnasal drip, sore throat, tinnitus and trouble swallowing.        Pain over mastoid  Eyes: Negative for photophobia, pain and redness.  Respiratory: Negative for chest tightness, shortness of breath and wheezing.   Cardiovascular: Negative for chest pain and palpitations.  Gastrointestinal: Negative for nausea and vomiting.  Neurological: Negative for weakness and headaches.  All other systems reviewed and are negative.    Physical Exam Updated Vital Signs BP (!) 159/86 (BP Location: Right Arm)   Pulse 63   Temp 98 F (36.7 C) (Oral)   Resp 18   Ht 6' (1.829 m)   Wt 70.8 kg (156 lb)   SpO2 100%   BMI 21.16 kg/m   Physical Exam  Constitutional: He  appears well-developed and well-nourished.  HENT:  Head: Normocephalic and atraumatic.    Right Ear: No drainage, swelling or tenderness. There is mastoid tenderness.  Left Ear: Hearing, tympanic membrane, external ear and ear canal normal.  Nose: Mucosal edema and rhinorrhea present. Right sinus exhibits no maxillary sinus tenderness and no frontal sinus tenderness. Left sinus exhibits no maxillary sinus tenderness and no frontal sinus tenderness.  Mouth/Throat: Uvula is midline, oropharynx is clear and moist and mucous membranes are normal.  Normal dentition. No oropharyngeal exudate, posterior oropharyngeal edema, posterior oropharyngeal erythema or tonsillar abscesses. No tonsillar exudate.  Right TM without cone of light.   Eyes: Pupils are equal, round, and reactive to light. Conjunctivae, EOM and lids are normal. Lids are everted and swept, no foreign bodies found. Right eye exhibits no discharge. Left eye exhibits no discharge. Right conjunctiva is not injected. Left conjunctiva is not injected. No scleral icterus.  Neck: Trachea normal, normal range of motion, full passive range of motion without pain and phonation normal. Neck supple. No tracheal tenderness, no spinous process tenderness and no muscular tenderness present. No neck rigidity. No edema, no erythema and normal range of motion present.  Pulmonary/Chest: Effort normal. No respiratory distress.  Lymphadenopathy:       Head (right side): No submental, no submandibular, no tonsillar, no preauricular, no posterior auricular and no occipital adenopathy present.       Head (left side): No submental, no submandibular, no tonsillar, no preauricular, no posterior auricular and no occipital adenopathy present.    He has no cervical adenopathy.  Neurological: He is alert.  Skin: Skin is warm, dry and intact. No erythema. No pallor.  No mastoid swelling, bogginess, erythema, heat, fluctuance or induration.   Psychiatric: He has a normal mood and affect.  Nursing note and vitals reviewed.    ED Treatments / Results  Labs (all labs ordered are listed, but only abnormal results are displayed) Labs Reviewed - No data to display  EKG  EKG Interpretation None       Radiology Ct Head Wo Contrast  Result Date: 01/24/2017 CLINICAL DATA:  Right ear pain, worse at night for 3 weeks. Nasal drainage. EXAM: CT HEAD WITHOUT CONTRAST TECHNIQUE: Contiguous axial images were obtained from the base of the skull through the vertex without intravenous contrast. COMPARISON:  Head CT  dated 05/03/2013. FINDINGS: Brain: Mild generalized age related parenchymal atrophy with commensurate dilatation of the ventricles and sulci. Ventricles are stable in size and configuration. Minimal chronic small vessel ischemic change noted within the deep periventricular white matter. No mass, hemorrhage, edema or other evidence of acute parenchymal abnormality. No extra-axial hemorrhage. Vascular: There are chronic calcified atherosclerotic changes of the large vessels at the skull base. No unexpected hyperdense vessel. Skull: Normal. Negative for fracture or focal lesion. Sinuses/Orbits: No acute finding. Other: Right external auditory canal appears clear. Right middle ear cavity is clear. Mastoid air cells are clear. IMPRESSION: 1. Negative head CT.  No intracranial mass, hemorrhage or edema. 2. No source for right ear pain identified. Right external auditory canal appears clear. Right middle ear cavity is clear. Right mastoid air cells are clear. Electronically Signed   By: Bary Richard M.D.   On: 01/24/2017 21:17    Procedures Procedures (including critical care time)  Medications Ordered in ED Medications - No data to display   Initial Impression / Assessment and Plan / ED Course  I have reviewed the triage vital signs and the nursing notes.  Pertinent  labs & imaging results that were available during my care of the patient were reviewed by me and considered in my medical decision making (see chart for details).     71 year old male with history of diabetes presenting for right mastoid pain. No preceding ear infections or illnesses. Patient is afebrile on presentation. No fever or systemic symptoms at home. The patient has mastoid tenderness without swelling, bogginess, erythema, heat, fluctuance or induration. Right TM does have loss of cone of light. Discussed with Dr. Dalene Seltzer who saw the patient and advised CT head without contrast as the patient appears stable. CT without evidence of  cause for pain. Right mastoid air cells clear. Will treat the patient for AOM due to ear exam. Patient also reported history of rhinorrhea, congestion and sneezing this morning. Patient has noted mucosal edema on exam. No sinus TTP. Will start on allergy medication for this. Patient may require additional imaging, including MRI non-urgently that can be done on an outpatient basis. Advised for the patient to follow up with PCP this week so this can be further evaluated and scheduled. Patient given strict return precautions. Patient in agreement with plan and appears safe for discharge.   Final Clinical Impressions(s) / ED Diagnoses   Final diagnoses:  Right ear pain    New Prescriptions Discharge Medication List as of 01/24/2017 10:07 PM    START taking these medications   Details  amoxicillin (AMOXIL) 500 MG capsule Take 1 capsule (500 mg total) by mouth 2 (two) times daily., Starting Thu 01/24/2017, Print    Cetirizine HCl 10 MG TBDP Take 10 mg by mouth daily., Starting Thu 01/24/2017, Print    fluticasone (FLONASE) 50 MCG/ACT nasal spray Place 2 sprays into both nostrils daily., Starting Thu 01/24/2017, Print         Jacinto Halim, PA-C 01/25/17 Pearla Dubonnet, MD 01/25/17 [phone]

## 2017-02-15 DIAGNOSIS — Z23 Encounter for immunization: Secondary | ICD-10-CM | POA: Diagnosis not present

## 2017-02-15 DIAGNOSIS — M542 Cervicalgia: Secondary | ICD-10-CM | POA: Diagnosis not present

## 2017-02-18 ENCOUNTER — Other Ambulatory Visit: Payer: Self-pay | Admitting: Otolaryngology

## 2017-02-18 DIAGNOSIS — M542 Cervicalgia: Secondary | ICD-10-CM

## 2017-02-19 ENCOUNTER — Ambulatory Visit
Admission: RE | Admit: 2017-02-19 | Discharge: 2017-02-19 | Disposition: A | Discharge status: Home / Self Care | Payer: Medicare Other | Source: Ambulatory Visit | Attending: Otolaryngology | Admitting: Otolaryngology

## 2017-02-19 DIAGNOSIS — M542 Cervicalgia: Secondary | ICD-10-CM | POA: Diagnosis not present

## 2017-02-22 ENCOUNTER — Encounter (INDEPENDENT_AMBULATORY_CARE_PROVIDER_SITE_OTHER): Payer: Medicare Other | Admitting: Ophthalmology

## 2017-02-22 DIAGNOSIS — H33302 Unspecified retinal break, left eye: Secondary | ICD-10-CM | POA: Diagnosis not present

## 2017-02-22 DIAGNOSIS — H2513 Age-related nuclear cataract, bilateral: Secondary | ICD-10-CM

## 2017-02-22 DIAGNOSIS — H43813 Vitreous degeneration, bilateral: Secondary | ICD-10-CM

## 2017-02-22 DIAGNOSIS — H35033 Hypertensive retinopathy, bilateral: Secondary | ICD-10-CM

## 2017-02-22 DIAGNOSIS — I10 Essential (primary) hypertension: Secondary | ICD-10-CM | POA: Diagnosis not present

## 2017-03-18 DIAGNOSIS — H5203 Hypermetropia, bilateral: Secondary | ICD-10-CM | POA: Diagnosis not present

## 2017-07-11 DIAGNOSIS — E1165 Type 2 diabetes mellitus with hyperglycemia: Secondary | ICD-10-CM | POA: Diagnosis not present

## 2017-07-11 DIAGNOSIS — E785 Hyperlipidemia, unspecified: Secondary | ICD-10-CM | POA: Diagnosis not present

## 2017-07-18 DIAGNOSIS — I129 Hypertensive chronic kidney disease with stage 1 through stage 4 chronic kidney disease, or unspecified chronic kidney disease: Secondary | ICD-10-CM | POA: Diagnosis not present

## 2017-07-18 DIAGNOSIS — E1165 Type 2 diabetes mellitus with hyperglycemia: Secondary | ICD-10-CM | POA: Diagnosis not present

## 2017-07-18 DIAGNOSIS — E785 Hyperlipidemia, unspecified: Secondary | ICD-10-CM | POA: Diagnosis not present

## 2017-07-18 DIAGNOSIS — E1122 Type 2 diabetes mellitus with diabetic chronic kidney disease: Secondary | ICD-10-CM | POA: Diagnosis not present

## 2017-07-18 DIAGNOSIS — N182 Chronic kidney disease, stage 2 (mild): Secondary | ICD-10-CM | POA: Diagnosis not present

## 2017-07-18 DIAGNOSIS — R251 Tremor, unspecified: Secondary | ICD-10-CM | POA: Diagnosis not present

## 2017-10-12 ENCOUNTER — Encounter (HOSPITAL_COMMUNITY): Payer: Self-pay | Admitting: Emergency Medicine

## 2017-10-12 ENCOUNTER — Emergency Department (HOSPITAL_COMMUNITY)
Admission: EM | Admit: 2017-10-12 | Discharge: 2017-10-12 | Disposition: A | Discharge status: Home / Self Care | Payer: Medicare Other | Attending: Emergency Medicine | Admitting: Emergency Medicine

## 2017-10-12 ENCOUNTER — Emergency Department (HOSPITAL_COMMUNITY): Payer: Medicare Other

## 2017-10-12 DIAGNOSIS — J069 Acute upper respiratory infection, unspecified: Secondary | ICD-10-CM | POA: Insufficient documentation

## 2017-10-12 DIAGNOSIS — E1165 Type 2 diabetes mellitus with hyperglycemia: Secondary | ICD-10-CM | POA: Diagnosis not present

## 2017-10-12 DIAGNOSIS — R739 Hyperglycemia, unspecified: Secondary | ICD-10-CM

## 2017-10-12 DIAGNOSIS — I10 Essential (primary) hypertension: Secondary | ICD-10-CM | POA: Diagnosis not present

## 2017-10-12 DIAGNOSIS — Z7984 Long term (current) use of oral hypoglycemic drugs: Secondary | ICD-10-CM | POA: Insufficient documentation

## 2017-10-12 DIAGNOSIS — R05 Cough: Secondary | ICD-10-CM | POA: Diagnosis not present

## 2017-10-12 DIAGNOSIS — B9789 Other viral agents as the cause of diseases classified elsewhere: Secondary | ICD-10-CM

## 2017-10-12 DIAGNOSIS — Z79899 Other long term (current) drug therapy: Secondary | ICD-10-CM | POA: Insufficient documentation

## 2017-10-12 LAB — CBC
HCT: 38.9 % — ABNORMAL LOW (ref 39.0–52.0)
HEMOGLOBIN: 13 g/dL (ref 13.0–17.0)
MCH: 28 pg (ref 26.0–34.0)
MCHC: 33.4 g/dL (ref 30.0–36.0)
MCV: 83.7 fL (ref 78.0–100.0)
Platelets: 231 10*3/uL (ref 150–400)
RBC: 4.65 MIL/uL (ref 4.22–5.81)
RDW: 13.2 % (ref 11.5–15.5)
WBC: 5.9 10*3/uL (ref 4.0–10.5)

## 2017-10-12 LAB — I-STAT TROPONIN, ED: TROPONIN I, POC: 0 ng/mL (ref 0.00–0.08)

## 2017-10-12 LAB — BASIC METABOLIC PANEL
ANION GAP: 12 (ref 5–15)
BUN: 11 mg/dL (ref 6–20)
CALCIUM: 9.2 mg/dL (ref 8.9–10.3)
CO2: 27 mmol/L (ref 22–32)
Chloride: 97 mmol/L — ABNORMAL LOW (ref 101–111)
Creatinine, Ser: 1.1 mg/dL (ref 0.61–1.24)
GFR calc non Af Amer: >60 mL/min (ref >60)
Glucose, Bld: 258 mg/dL — ABNORMAL HIGH (ref 65–99)
Potassium: 3.4 mmol/L — ABNORMAL LOW (ref 3.5–5.1)
SODIUM: 136 mmol/L (ref 135–145)

## 2017-10-12 NOTE — ED Triage Notes (Signed)
Pt presents to ED for 1 week of dizziness, cough, congestion, midsternal chest pain with coughing, and light-headedness.  Patient also states he is under a lot of stress at home, has had some nausea, and some sOB.

## 2017-10-12 NOTE — ED Provider Notes (Signed)
[facility]  CSN: [id number] Arrival date & time: 10/12/17  1712  History   Chief Complaint Chief Complaint  Patient presents with  . URI    HPI Paul Roberts is a 72 y.o. male with a medical history of HTN and Type 2 DM who presented to the ED for upper respiratory symptoms x 3 days. Patient endorses cough, nasal congestion, rhinorrhea and postnasal drip. Endorses exposure to his wife who has had similar symptoms. Patient has tried Robitussin prior to coming to the ED. Denies fever, chest pain, SOB, palpitations, sore throat, otalgia and eye pain.  Past Medical History:  Diagnosis Date  . Diabetes mellitus without complication (HCC)   . Hypertension     There are no active problems to display for this patient.   History reviewed. No pertinent surgical history.      Home Medications    Prior to Admission medications   Medication Sig Start Date End Date Taking? Authorizing Provider  dextromethorphan-guaiFENesin (ROBITUSSIN-DM) 10-100 MG/5ML liquid Take 10 mLs by mouth every 4 (four) hours as needed for cough.   Yes [provider]  metFORMIN (GLUCOPHAGE-XR) 500 MG 24 hr tablet Take 500 mg by mouth 2 (two) times daily. 09/27/17  Yes [provider]  valsartan-hydrochlorothiazide (DIOVAN-HCT) 160-25 MG per tablet Take 1 tablet by mouth at bedtime.    Yes [provider]  Cetirizine HCl 10 MG TBDP Take 10 mg by mouth daily. Patient not taking: Reported on 10/12/2017 01/24/17   Jacinto HalimMaczis, Michael M, PA-C  fluticasone [facility](FLONASE) 50 MCG/ACT nasal spray Place 2 sprays into both nostrils daily. Patient not taking: Reported on 10/12/2017 01/24/17   Maczis, Elmer SowMichael M, PA-C  meclizine (ANTIVERT) 25 MG tablet Take 1 tablet (25 mg total) by mouth 3 (three) times daily as needed for dizziness. Patient not taking: Reported on 10/12/2017 05/03/13   Geoffery Lyonselo, Douglas, MD    Family History History reviewed. No pertinent family  history.  Social History Social History   Tobacco Use  . Smoking status: Former Games developermoker  . Smokeless tobacco: Never Used  Substance Use Topics  . Alcohol use: No  . Drug use: No     Allergies   Sulfa antibiotics   Review of Systems Review of Systems  Constitutional: Negative for chills, fatigue and fever.  HENT: Positive for congestion, postnasal drip and rhinorrhea. Negative for ear pain, sinus pressure, sinus pain, sore throat and trouble swallowing.   Eyes: Negative for pain and visual disturbance.  Respiratory: Positive for cough. Negative for chest tightness and shortness of breath.   Cardiovascular: Negative for chest pain, palpitations and leg swelling.  Musculoskeletal: Negative for myalgias.  Skin: Negative.   Neurological: Negative for numbness and headaches.     Physical Exam Updated Vital Signs BP 136/69   Pulse 70   Temp 98.7 F (37.1 C) (Oral)   Resp 18   SpO2 99%   Physical Exam  Constitutional: He appears well-developed and well-nourished. No distress.  HENT:  Head: Normocephalic and atraumatic.  Right Ear: External ear normal.  Left Ear: External ear normal.  Mouth/Throat: Oropharynx is clear and moist.  Eyes: Pupils are equal, round, and reactive to light. Conjunctivae and EOM are normal.  Neck: Normal range of motion. Neck supple.  Cardiovascular: Normal rate, regular rhythm and normal heart sounds.  Pulmonary/Chest: Effort normal and breath sounds normal.  Lymphadenopathy:    He has no cervical adenopathy.  Skin: Skin is warm. Capillary refill takes  less than 2 seconds.  Nursing note and vitals reviewed.    ED Treatments / Results  Labs (all labs ordered are listed, but only abnormal results are displayed) Labs Reviewed  BASIC METABOLIC PANEL - Abnormal; Notable for the following components:      Result Value   Potassium 3.4 (*)    Chloride 97 (*)    Glucose, Bld 258 (*)    All other components within normal limits  CBC -  Abnormal; Notable for the following components:   HCT 38.9 (*)    All other components within normal limits  I-STAT TROPONIN, ED    EKG None  Radiology Dg Chest 2 View  Result Date: 10/12/2017 CLINICAL DATA:  Cough and congestion for 5 days EXAM: CHEST - 2 VIEW COMPARISON:  March 21, 2013 FINDINGS: The heart size and mediastinal contours are within normal limits. There is no focal infiltrate, pulmonary edema, or pleural effusion. There is scoliosis of spine. IMPRESSION: No active cardiopulmonary disease. Electronically Signed   By: Sherian Rein M.D.   On: 10/12/2017 18:14    Procedures Procedures (including critical care time)  Medications Ordered in ED Medications - No data to display   Initial Impression / Assessment and Plan / ED Course  Triage vital signs and the nursing notes have been reviewed.  Pertinent labs & imaging results that were available during care of the patient were reviewed and considered in medical decision making (see chart for details).  Patient presents in no acute distress and afebrile with viral URI symptoms. Pt's history and clinical presentation is not concerning for bacterial etiology. Initial cardiac workup which included troponin and EKG was unremarkable and helpful in evaluating cardiac etiology. Education provided on OTC and supportive treatment for symptom relief.   Clinical Course as of Oct 12 2057  Sat Oct 12, 2017  2056 CXR was normal. No acute cardiopulm abnormalities: PNA, edema or cardiomegaly. Labs are reassuring.   [GM]  2057 Hyperglycemic, but asymptomatic. Patient takes Metformin as prescribed by his PCP. He was advised to follow-up with them soon to discuss glucose management.   [GM]    Clinical Course User Index [GM] Paul Roberts, Paul Medicus, PA-C    Final Clinical Impressions(s) / ED Diagnoses  1. Viral URI. Education provided on OTC and supportive treatment options. 2. Hyperglycemia. Glucose at 258 upon arrival (~ 3 hours after  pt's last meal). Asymptomatic and no signs of end organ damage. Education provided and advised to follow-up with his PCP regarding management.  Dispo: Home. After thorough clinical evaluation, this patient is determined to be medically stable and can be safely discharged with the previously mentioned treatment and/or outpatient follow-up/referral(s). At this time, there are no other apparent medical conditions that require further screening, evaluation or treatment.  Final diagnoses:  None    ED Discharge Orders    None       Reva Bores 10/12/17 2113    Gerhard Munch, MD 10/13/17 [phone]

## 2017-10-12 NOTE — Discharge Instructions (Addendum)
You have a viral respiratory infection (cold). You can use the following OTC treatments for symptom relief. Robitussin, for cough Sudafed or Mucinex, for congestion Tylenol or ibuprofen, for fever  Follow-up with your PCP as soon as possible to discuss blood sugar.

## 2017-10-22 DIAGNOSIS — I129 Hypertensive chronic kidney disease with stage 1 through stage 4 chronic kidney disease, or unspecified chronic kidney disease: Secondary | ICD-10-CM | POA: Diagnosis not present

## 2017-10-22 DIAGNOSIS — R251 Tremor, unspecified: Secondary | ICD-10-CM | POA: Diagnosis not present

## 2017-10-22 DIAGNOSIS — E876 Hypokalemia: Secondary | ICD-10-CM | POA: Diagnosis not present

## 2017-10-22 DIAGNOSIS — I10 Essential (primary) hypertension: Secondary | ICD-10-CM | POA: Diagnosis not present

## 2017-10-22 DIAGNOSIS — E1165 Type 2 diabetes mellitus with hyperglycemia: Secondary | ICD-10-CM | POA: Diagnosis not present

## 2017-10-22 DIAGNOSIS — J069 Acute upper respiratory infection, unspecified: Secondary | ICD-10-CM | POA: Diagnosis not present

## 2017-10-22 DIAGNOSIS — E1122 Type 2 diabetes mellitus with diabetic chronic kidney disease: Secondary | ICD-10-CM | POA: Diagnosis not present

## 2017-11-04 DIAGNOSIS — E1165 Type 2 diabetes mellitus with hyperglycemia: Secondary | ICD-10-CM | POA: Diagnosis not present

## 2017-12-16 ENCOUNTER — Ambulatory Visit: Payer: Medicare Other | Admitting: Neurology

## 2018-01-24 DIAGNOSIS — Z23 Encounter for immunization: Secondary | ICD-10-CM | POA: Diagnosis not present

## 2018-02-04 DIAGNOSIS — I10 Essential (primary) hypertension: Secondary | ICD-10-CM | POA: Diagnosis not present

## 2018-02-04 DIAGNOSIS — E1165 Type 2 diabetes mellitus with hyperglycemia: Secondary | ICD-10-CM | POA: Diagnosis not present

## 2018-02-04 DIAGNOSIS — E785 Hyperlipidemia, unspecified: Secondary | ICD-10-CM | POA: Diagnosis not present

## 2018-02-04 DIAGNOSIS — R251 Tremor, unspecified: Secondary | ICD-10-CM | POA: Diagnosis not present

## 2018-02-07 DIAGNOSIS — N182 Chronic kidney disease, stage 2 (mild): Secondary | ICD-10-CM | POA: Diagnosis not present

## 2018-02-07 DIAGNOSIS — E785 Hyperlipidemia, unspecified: Secondary | ICD-10-CM | POA: Diagnosis not present

## 2018-02-07 DIAGNOSIS — R251 Tremor, unspecified: Secondary | ICD-10-CM | POA: Diagnosis not present

## 2018-02-07 DIAGNOSIS — E1165 Type 2 diabetes mellitus with hyperglycemia: Secondary | ICD-10-CM | POA: Diagnosis not present

## 2018-02-07 DIAGNOSIS — I129 Hypertensive chronic kidney disease with stage 1 through stage 4 chronic kidney disease, or unspecified chronic kidney disease: Secondary | ICD-10-CM | POA: Diagnosis not present

## 2018-02-07 DIAGNOSIS — E1122 Type 2 diabetes mellitus with diabetic chronic kidney disease: Secondary | ICD-10-CM | POA: Diagnosis not present

## 2018-02-07 DIAGNOSIS — E039 Hypothyroidism, unspecified: Secondary | ICD-10-CM | POA: Diagnosis not present

## 2018-02-28 ENCOUNTER — Encounter (INDEPENDENT_AMBULATORY_CARE_PROVIDER_SITE_OTHER): Payer: Medicare Other | Admitting: Ophthalmology

## 2018-02-28 DIAGNOSIS — H43813 Vitreous degeneration, bilateral: Secondary | ICD-10-CM

## 2018-02-28 DIAGNOSIS — I10 Essential (primary) hypertension: Secondary | ICD-10-CM

## 2018-02-28 DIAGNOSIS — H33303 Unspecified retinal break, bilateral: Secondary | ICD-10-CM | POA: Diagnosis not present

## 2018-02-28 DIAGNOSIS — H35033 Hypertensive retinopathy, bilateral: Secondary | ICD-10-CM

## 2018-03-13 ENCOUNTER — Encounter (INDEPENDENT_AMBULATORY_CARE_PROVIDER_SITE_OTHER): Payer: Medicare Other | Admitting: Ophthalmology

## 2018-03-13 DIAGNOSIS — H33301 Unspecified retinal break, right eye: Secondary | ICD-10-CM

## 2018-04-15 ENCOUNTER — Emergency Department (HOSPITAL_COMMUNITY)
Admission: EM | Admit: 2018-04-15 | Discharge: 2018-04-15 | Disposition: A | Discharge status: Home / Self Care | Payer: Medicare Other | Attending: Emergency Medicine | Admitting: Emergency Medicine

## 2018-04-15 ENCOUNTER — Encounter (HOSPITAL_COMMUNITY): Payer: Self-pay | Admitting: *Deleted

## 2018-04-15 ENCOUNTER — Emergency Department (HOSPITAL_COMMUNITY): Payer: Medicare Other

## 2018-04-15 DIAGNOSIS — Z7984 Long term (current) use of oral hypoglycemic drugs: Secondary | ICD-10-CM | POA: Diagnosis not present

## 2018-04-15 DIAGNOSIS — M5431 Sciatica, right side: Secondary | ICD-10-CM | POA: Diagnosis not present

## 2018-04-15 DIAGNOSIS — I10 Essential (primary) hypertension: Secondary | ICD-10-CM | POA: Insufficient documentation

## 2018-04-15 DIAGNOSIS — Z79899 Other long term (current) drug therapy: Secondary | ICD-10-CM | POA: Insufficient documentation

## 2018-04-15 DIAGNOSIS — Z87891 Personal history of nicotine dependence: Secondary | ICD-10-CM | POA: Diagnosis not present

## 2018-04-15 DIAGNOSIS — M5441 Lumbago with sciatica, right side: Secondary | ICD-10-CM | POA: Diagnosis not present

## 2018-04-15 DIAGNOSIS — M545 Low back pain: Secondary | ICD-10-CM | POA: Diagnosis not present

## 2018-04-15 DIAGNOSIS — E119 Type 2 diabetes mellitus without complications: Secondary | ICD-10-CM | POA: Insufficient documentation

## 2018-04-15 NOTE — ED Provider Notes (Signed)
[name] EMERGENCY DEPARTMENT Provider Note   CSN: [id number] Arrival date & time: 04/15/18  0940   History   Chief Complaint Chief Complaint  Patient presents with  . Hip Pain    HPI Paul Roberts is a 72 y.o. male.   HPI   72 year old male presents today with complaints of left-sided low back and hip pain.  Patient notes symptoms started yesterday with a pain in the left lower back that radiates down buttocks and into the posterior aspect of the leg.  He notes this is slightly worse with palpation and worse with movement including straight leg elevation.  Patient notes that yesterday he used some topical ointment which provided some relief, he notes taking 1 ibuprofen before bed last night it did not significantly improve his symptoms.  He denies any loss of distal sensation strength or motor function.  He denies any trauma to his low back but does note that he was moving the day prior to onset of symptoms.  Patient notes normal urine and bowel control.  Past Medical History:  Diagnosis Date  . Diabetes mellitus without complication (HCC)   . Hypertension     There are no active problems to display for this patient.   History reviewed. No pertinent surgical history.      Home Medications    Prior to Admission medications   Medication Sig Start Date End Date Taking? Authorizing Provider  Cetirizine HCl 10 MG TBDP Take 10 mg by mouth daily. Patient not taking: Reported on 10/12/2017 01/24/17   Maczis, Elmer Sow, PA-C  dextromethorphan-guaiFENesin (ROBITUSSIN-DM) 10-100 MG/5ML liquid Take 10 mLs by mouth every 4 (four) hours as needed for cough.    [provider]  fluticasone (FLONASE) 50 MCG/ACT nasal spray Place 2 sprays into both nostrils daily. Patient not taking: Reported on 10/12/2017 01/24/17   Maczis, Elmer Sow, PA-C  meclizine (ANTIVERT) 25 MG tablet Take 1 tablet (25 mg total) by mouth 3 (three) times daily as needed for  dizziness. Patient not taking: Reported on 10/12/2017 05/03/13   Geoffery Lyons, MD  metFORMIN (GLUCOPHAGE-XR) 500 MG 24 hr tablet Take 500 mg by mouth 2 (two) times daily. 09/27/17   [provider]  valsartan-hydrochlorothiazide (DIOVAN-HCT) 160-25 MG per tablet Take 1 tablet by mouth at bedtime.     [provider]    Family History History reviewed. No pertinent family history.  Social History Social History   Tobacco Use  . Smoking status: Former Games developer  . Smokeless tobacco: Never Used  Substance Use Topics  . Alcohol use: No  . Drug use: No     Allergies   Sulfa antibiotics   Review of Systems Review of Systems  All other systems reviewed and are negative.    Physical Exam Updated Vital Signs BP 132/62 (BP Location: Left Arm)   Pulse 80   Temp 98.3 F (36.8 C) (Oral)   Resp 16   SpO2 98%   Physical Exam  Constitutional: He is oriented to person, place, and time. He appears well-developed and well-nourished.  HENT:  Head: Normocephalic and atraumatic.  Eyes: Pupils are equal, round, and reactive to light. Conjunctivae are normal. Right eye exhibits no discharge. Left eye exhibits no discharge. No scleral icterus.  Neck: Normal range of motion. No JVD present. No tracheal deviation present.  Pulmonary/Chest: Effort normal. No stridor.  Musculoskeletal:  No CT or L-spine tenderness palpation, tenderness palpation of the left lateral lower lumbar musculature and upper gluteus,  straight leg positive left, straight leg negative right, distal sensation strength and motor function intact-patient is unable to fully relax to complete patellar reflex test  Neurological: He is alert and oriented to person, place, and time. Coordination normal.  Psychiatric: He has a normal mood and affect. His behavior is normal. Judgment and thought content normal.  Nursing note and vitals reviewed.   ED Treatments / Results  Labs (all labs ordered are listed, but only  abnormal results are displayed) Labs Reviewed - No data to display  EKG None  Radiology Dg Lumbar Spine Complete  Result Date: 04/15/2018 CLINICAL DATA:  Back pain began 1 week ago, no injury EXAM: LUMBAR SPINE - COMPLETE 4+ VIEW COMPARISON:  None. FINDINGS: There is mild curvature of the lumbar spine convex to the left by 12 degrees. There is mild degenerative disc disease at L1-2 with some loss of disc space and spurring. Also anterior osteophyte formation is present as well at L2-3, L3-4, and L4-5 levels. However the disc spaces of those levels appear normal. No compression deformity is seen. Very mild curvature of the lumbar spine on the frontal view is noted convex to the left by 12 degrees of questionable significance. On oblique views there is significant degenerative change involving the facet joints of L5-S1. The remainder of facet joints are unremarkable. However, on the frontal view there is a question of a right renal pelvic calculus. If a renal calculus is thought to be a possible cause of the pain, then CT of the abdomen and pelvis may be helpful to evaluate further. The bowel gas pattern is nonspecific with a moderate amount of feces throughout the colon. IMPRESSION: 1. Very mild curvature of the lumbar spine convex to the left by 12 degrees. Degenerative disc disease primarily at L1-2. 2. Degenerative change of the facet joints of L5-S1. 3. Questionable right renal calculus overlying the right renal pelvis. Consider CT of the abdomen pelvis to assess further if warranted clinically. Electronically Signed   By: Dwyane DeePaul  Barry M.D.   On: 04/15/2018 10:53    Procedures Procedures (including critical care time)  Medications Ordered in ED Medications - No data to display   Initial Impression / Assessment and Plan / ED Course  I have reviewed the triage vital signs and the nursing notes.  Pertinent labs & imaging results that were available during my care of the patient were reviewed by  me and considered in my medical decision making (see chart for details).     Labs:   Imaging: DG lumbar spine complete  Consults:  Therapeutics:  Discharge Meds:   Assessment/Plan: 72 year old male presents today with low back pain likely sciatica.  No neurological deficits, no red flags.  Patient will follow-up as an outpatient with primary care symptomatic care instructions given.  Patient verbalized understanding and agreement to today's plan.    Final Clinical Impressions(s) / ED Diagnoses   Final diagnoses:  Sciatica of right side    ED Discharge Orders    None       Rosalio LoudHedges, Kimberley Dastrup, PA-C 04/15/18 1119    Virgina Norfolkuratolo, Adam, DO 04/15/18 1945

## 2018-04-15 NOTE — ED Notes (Signed)
Patient transported to X-ray 

## 2018-04-15 NOTE — Discharge Instructions (Addendum)
Please read attached information. If you experience any new or worsening signs or symptoms please return to the emergency room for evaluation. Please follow-up with your primary care provider or specialist as discussed. Please use tylenol or ibuprofen as needed for pain.  °

## 2018-04-15 NOTE — ED Notes (Signed)
Patient verbalizes understanding of discharge instructions. Opportunity for questioning and answers were provided. Armband removed by staff, pt discharged from ED ambulatory to home.  

## 2018-04-15 NOTE — ED Triage Notes (Signed)
Pt in c/o left hip pain that he first noticed last night, radiates into his buttocks, denies injury, pain is worse with movement and certain positions, ambulatory without distress

## 2018-05-14 ENCOUNTER — Encounter (INDEPENDENT_AMBULATORY_CARE_PROVIDER_SITE_OTHER): Payer: Medicare Other | Admitting: Ophthalmology

## 2018-05-14 DIAGNOSIS — H2513 Age-related nuclear cataract, bilateral: Secondary | ICD-10-CM

## 2018-05-14 DIAGNOSIS — I10 Essential (primary) hypertension: Secondary | ICD-10-CM

## 2018-05-14 DIAGNOSIS — H33303 Unspecified retinal break, bilateral: Secondary | ICD-10-CM

## 2018-05-14 DIAGNOSIS — H35033 Hypertensive retinopathy, bilateral: Secondary | ICD-10-CM

## 2018-05-14 DIAGNOSIS — H43813 Vitreous degeneration, bilateral: Secondary | ICD-10-CM

## 2018-05-28 ENCOUNTER — Encounter (INDEPENDENT_AMBULATORY_CARE_PROVIDER_SITE_OTHER): Payer: Medicare Other | Admitting: Ophthalmology

## 2018-05-28 DIAGNOSIS — H33303 Unspecified retinal break, bilateral: Secondary | ICD-10-CM

## 2018-06-09 DIAGNOSIS — R05 Cough: Secondary | ICD-10-CM | POA: Diagnosis not present

## 2018-06-09 DIAGNOSIS — J4521 Mild intermittent asthma with (acute) exacerbation: Secondary | ICD-10-CM | POA: Diagnosis not present

## 2018-06-25 ENCOUNTER — Ambulatory Visit: Payer: Medicare Other | Admitting: Diagnostic Neuroimaging

## 2018-10-01 ENCOUNTER — Encounter (INDEPENDENT_AMBULATORY_CARE_PROVIDER_SITE_OTHER): Payer: Medicare Other | Admitting: Ophthalmology

## 2018-10-09 ENCOUNTER — Encounter (INDEPENDENT_AMBULATORY_CARE_PROVIDER_SITE_OTHER): Payer: Medicare Other | Admitting: Ophthalmology

## 2019-01-21 ENCOUNTER — Encounter (INDEPENDENT_AMBULATORY_CARE_PROVIDER_SITE_OTHER): Payer: Medicare Other | Admitting: Ophthalmology

## 2019-01-23 DIAGNOSIS — Z23 Encounter for immunization: Secondary | ICD-10-CM | POA: Diagnosis not present

## 2019-03-19 ENCOUNTER — Encounter (INDEPENDENT_AMBULATORY_CARE_PROVIDER_SITE_OTHER): Payer: Medicare Other | Admitting: Ophthalmology

## 2019-03-19 DIAGNOSIS — H33303 Unspecified retinal break, bilateral: Secondary | ICD-10-CM

## 2019-03-19 DIAGNOSIS — I10 Essential (primary) hypertension: Secondary | ICD-10-CM

## 2019-03-19 DIAGNOSIS — H35033 Hypertensive retinopathy, bilateral: Secondary | ICD-10-CM | POA: Diagnosis not present

## 2019-03-19 DIAGNOSIS — H43813 Vitreous degeneration, bilateral: Secondary | ICD-10-CM | POA: Diagnosis not present

## 2019-04-14 ENCOUNTER — Ambulatory Visit (INDEPENDENT_AMBULATORY_CARE_PROVIDER_SITE_OTHER): Payer: Medicare Other | Admitting: Neurology

## 2019-04-14 ENCOUNTER — Encounter: Payer: Self-pay | Admitting: Neurology

## 2019-04-14 ENCOUNTER — Other Ambulatory Visit: Payer: Self-pay

## 2019-04-14 VITALS — BP 149/83 | HR 77 | Temp 96.9°F | Ht 67.0 in | Wt 160.0 lb

## 2019-04-14 DIAGNOSIS — F4321 Adjustment disorder with depressed mood: Secondary | ICD-10-CM | POA: Diagnosis not present

## 2019-04-14 DIAGNOSIS — R251 Tremor, unspecified: Secondary | ICD-10-CM | POA: Diagnosis not present

## 2019-04-14 DIAGNOSIS — R259 Unspecified abnormal involuntary movements: Secondary | ICD-10-CM | POA: Insufficient documentation

## 2019-04-14 DIAGNOSIS — H519 Unspecified disorder of binocular movement: Secondary | ICD-10-CM

## 2019-04-14 DIAGNOSIS — M6289 Other specified disorders of muscle: Secondary | ICD-10-CM

## 2019-04-14 DIAGNOSIS — G252 Other specified forms of tremor: Secondary | ICD-10-CM

## 2019-04-14 MED ORDER — CARBIDOPA-LEVODOPA 25-100 MG PO TABS: 1.0000 | ORAL_TABLET | Freq: Three times a day (TID) | ORAL | 2 refills | Status: DC

## 2019-04-14 MED ORDER — SERTRALINE HCL 25 MG PO TABS: 25.0000 mg | ORAL_TABLET | Freq: Every day | ORAL | 5 refills | Status: DC

## 2019-04-14 NOTE — Progress Notes (Signed)
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   Paul Roberts is a 73 y.o. year old african left-handed  male patient seen here upon referral by Dr. Shelia Media on 04/14/2019  for evaluation of new onset tremor.  Chief concern according to patient : "My wife of 61 years just died unexpected this Summer- of an previously undiscovered lung cancer".    I have the pleasure of seeing Paul Roberts today, a ambidextrous - male with new onset tremors. He  has a past medical history of Diabetes mellitus without complication ([facility]) and Hypertension.  Further noted are his note by endocrinology that he suffers from chronic kidney disease stage III which was already noted in June 2015 and may have progressed, diabetes mellitus with renal manifestations, essential hypertension, elevated cholesterol, hypothyroidism.  His tremor began in the left hand which is his dominant hand he is ambidextrous but the left hand is his preferred hand, this could be more typical for a Parkinson manifestation however his gait seems to be curiously unaffected he could rise without bracing himself from a seated position.  He does not appear unsteady when he walks but he does have fragmented terms he needed more than 4 steps to turn 180 degrees.  Given the cogwheeling I will definitely follow a work-up for Parkinson's disease versus normal parkinsonism for alternative explanations for tremor.  He also had significantly more tremor in the left hand  when he performs the drawing of an Archimedes spiral.  Situationally there may be an depression and anxiety input and he has stated that he does not sleep very well since his wife died.  This has been 4 or 5 very difficult months for him and the tremor progressed during that time rapidly.  I would also consider offering an antidepressant for the time helping with sleep and grieving. He noted a tremor affecting his dominant left hand about 9 month ago and now this tremor affects the right side, too. He has difficulties raising a spoon or buttoning his shirt. Handwriting has been impaired.    Family medical history:  Unknown, his mother died when he was 39.    Social history: Patient is retired from Printmaker at [employer] and lives in a household alone. Family status is widowed , with adult daughter. One older daughter lives in [place].  The patient currently works part- time.  Tobacco use: smoked until 40 years ago.  ETOH use: rarely , Caffeine intake in form of Coffee( 3-4 a day) Soda( none ) Tea ( hot/ rarely) or energy drinks. Regular exercise in form of walking.   Hobbies : computers   Social habits are as follows: The patient's dinner time is between 5-6  PM.  The tremor gets worse as the day goes on, and most noticeable when lifting something heavy.   Review of Systems: Out of a complete 14 system review, the patient complains of only the  following symptoms, and all other reviewed systems are negative.:  Tremor   Grief.   Social History   Socioeconomic History   Marital status: Widowed    Spouse name: Not on file   Number of children: Not on file   Years of education: Not on file   Highest education level: Not on file  Occupational History   Not on file  Social Needs   Financial resource strain: Not on file   Food insecurity    Worry: Not on file    Inability: Not on file   Transportation needs    Medical: Not on file    Non-medical: Not on file  Tobacco Use    Smoking status: Former Smoker   Smokeless tobacco: Never Used  Substance and Sexual Activity   Alcohol use: No   Drug use: No   Sexual activity: Not on file  Lifestyle   Physical activity    Days per week: Not on file    Minutes per session: Not on file   Stress: Not on file  Relationships   Social connections    Talks on phone: Not on file    Gets together: Not on file    Attends religious service: Not on file    Active member of club or organization: Not on file    Attends meetings of clubs or organizations: Not on file    Relationship status: Not on file  Other Topics Concern   Not on file  Social History Narrative   Not on file    No family history on file.  Past Medical History:  Diagnosis Date   Diabetes mellitus without complication (HCC)    Hypertension     No past surgical history on file.   Current Outpatient Medications on File Prior to Visit  Medication Sig Dispense Refill   Cetirizine HCl 10 MG TBDP Take 10 mg by mouth daily. 30 tablet 0   fluticasone (FLONASE) 50 MCG/ACT nasal spray Place 2 sprays into both nostrils daily. 16 g 0   metFORMIN (GLUCOPHAGE-XR) 500 MG 24 hr tablet Take 500 mg by mouth 2 (two) times daily.  2   valsartan-hydrochlorothiazide (DIOVAN-HCT) 160-25 MG per tablet Take 1 tablet by mouth at bedtime.      No current facility-administered medications on file prior to visit.     Allergies  Allergen Reactions   Sulfa Antibiotics Other (See Comments)    Pt denies    Physical exam:  Today's Vitals   04/14/19 1408  BP: (!) 149/83  Pulse: 77  Temp: (!) 96.9 F (36.1 C)  TempSrc: Temporal  Weight: 160 lb (72.6 kg)  Height:  (1.702 m)   Body mass index is 25.06 kg/m.   Wt Readings from Last 3 Encounters:  04/14/19 160 lb (72.6 kg)  01/24/17 156 lb (70.8 kg)  05/03/13 142 lb (64.4 kg)     Ht Readings from Last 3 Encounters:  04/14/19  (1.702 m)  01/24/17 6' (1.829 m)  05/03/13  (1.702 m)        General: The patient is awake, alert and appears not in acute distress. The patient is well groomed. Head: Normocephalic, atraumatic. Neck is supple.  Cardiovascular:  Regular rate and cardiac rhythm by pulse,  without distended neck veins. Respiratory: Lungs are clear to auscultation.  Skin:  Without evidence of ankle edema, or rash. Trunk: The patient's posture is erect.   Neurologic exam : The patient is awake and alert, oriented to  place and time.   Memory subjective described as intact.  Attention span & concentration ability appears normal.  Speech is fluent,  without  dysarthria, dysphonia or aphasia.  Mood and affect are appropriate.   Cranial nerves: no loss of smell or taste reported  Pupils are equal and briskly reactive to light.neovascularization at the right cornea.  Funduscopic exam deferred.  Extraocular movements in vertical and horizontal planes were disturbed- he couldn't follow a moving object easily and always turned his head with the maneuver.    nystagmus? Difficulties to direct gaze horizontally, not vertically. No Diplopia. Visual field examination by finger perimetry were not sucessfull.  Hearing was intact to soft voice and finger rubbing.    Facial sensation intact to fine touch.  Facial motor strength is symmetric and tongue and uvula move midline.  Neck ROM : rotation, tilt and flexion extension were normal for age and shoulder shrug was symmetrical.    Motor exam:  Symmetric bulk, tone and ROM.   Increased muscle tone with cog wheeling, bilaterally over biceps -symmetric grip strength . Adduction, abduction and hip flexion were intact.  Sensory:  Fine touch, pinprick and vibration were normal.  Proprioception tested in the upper extremities was normal. Coordination: Rapid alternating movements in the fingers/hands were of reduced  speed.  The Finger-to-nose maneuver was  with evidence of ataxia, dysmetria and close to target tremor.  Gait and  station: Patient could rise unassisted from a seated position, walked without assistive device. He appears stooped and his arm swing is still present but reduced, there was no pill rolling tremor noted as he walked.  Stance is wider base and the patient turned with 5 steps.  Toe and heel walk were deferred.  Deep tendon reflexes: in the  upper and lower extremities are symmetrically attenuated-  and intact at trace.  Babinski response was deferred. Romberg negative.     After spending a total time of 45 minutes face to face and additional time for physical and neurologic examination, review of laboratory studies,  personal review of imaging studies, reports and results of other testing and review of referral information / records as far as provided in visit, I have established the following assessments:  , this could be more typical for a Parkinson manifestation however his gait seems to be curiously unaffected he could rise without bracing himself from a seated position.  He does not appear unsteady when he walks but he does have fragmented terms he needed more than 4 steps to turn 180 degrees.  Given the cogwheeling I will definitely follow a work-up for Parkinson's disease versus normal parkinsonism for alternative explanations for tremor.  He also had significantly more tremor in the left hand when he performs the drawing of an Archimedes spiral.     1)  His tremor and abnormal eye movements warrant a BRAIN MRI-  He has cog-wheeling, he has tremor, this has affected fine motor skills-  He denies any falls. He has no RLS and no disinhibition, no thoughts of suicide.    2)  Zoloft 25 mg nightly for sleep and anxiety,  3) OT /  PT with neuro- rehabilitation was suggested , but will be ordered after a trial of sinemet 25/ 100 mg tid.    Situationally there may be an depression and anxiety input and he has stated that he does not sleep very well since his wife died.  This has been 4 or 5 very  difficult months for him and  the tremor progressed during that time rapidly.  I would also consider offering an antidepressant for the time helping with sleep and grieving.    I would like to thank Merri BrunettePharr, Walter, MD and [facility] Hospital[facility]idubal Balan,MD- [address] [place] [id number] for allowing me to meet with and to take care of this pleasant patient.   In short, Paul Roberts Ventresca is presenting with a tremor affecting first and foremost his left- dominant hand.    I plan to follow up either personally within 3 month.   CC: I will share my notes with PCP   Electronically signed by: Melvyn Novasarmen Aurelius Gildersleeve, MD 04/14/2019 2:11 PM  [facility] and [facility] certified by The [employer] of Sleep Medicine and Diplomate of the [employer] of [employer] Medicine. Board certified In Neurology through the ABPN, Fellow of the [employer] of Neu[employer]. Medical Director of [facility].

## 2019-05-06 ENCOUNTER — Other Ambulatory Visit: Payer: Self-pay | Admitting: Neurology

## 2019-06-21 ENCOUNTER — Ambulatory Visit: Payer: Medicare Other | Attending: Internal Medicine

## 2019-06-21 DIAGNOSIS — Z23 Encounter for immunization: Secondary | ICD-10-CM | POA: Insufficient documentation

## 2019-06-21 NOTE — Progress Notes (Signed)
   Covid-19 Vaccination Clinic  Name:  Paul Roberts    MRN: [id number] DOB: 11/02/1945  06/21/2019  Paul Roberts was observed post Covid-19 immunization for 15 minutes without incidence. He was provided with Vaccine Information Sheet and instruction to access the V-Safe system.   Paul Roberts was instructed to call 911 with any severe reactions post vaccine: Marland Kitchen Difficulty breathing  . Swelling of your face and throat  . A fast heartbeat  . A bad rash all over your body  . Dizziness and weakness    Immunizations Administered    Name Date Dose VIS Date Route   Pfizer COVID-19 Vaccine 06/21/2019 11:17 AM 0.3 mL 04/17/2019 Intramuscular   Manufacturer: [employer], [employer]   Lot: [id number]   NDC: [address]-3

## 2019-07-06 DIAGNOSIS — I10 Essential (primary) hypertension: Secondary | ICD-10-CM | POA: Diagnosis not present

## 2019-07-06 DIAGNOSIS — Z125 Encounter for screening for malignant neoplasm of prostate: Secondary | ICD-10-CM | POA: Diagnosis not present

## 2019-07-06 DIAGNOSIS — N390 Urinary tract infection, site not specified: Secondary | ICD-10-CM | POA: Diagnosis not present

## 2019-07-06 DIAGNOSIS — E1165 Type 2 diabetes mellitus with hyperglycemia: Secondary | ICD-10-CM | POA: Diagnosis not present

## 2019-07-06 DIAGNOSIS — E785 Hyperlipidemia, unspecified: Secondary | ICD-10-CM | POA: Diagnosis not present

## 2019-07-13 ENCOUNTER — Ambulatory Visit: Payer: Medicare Other | Admitting: Neurology

## 2019-07-13 DIAGNOSIS — E1122 Type 2 diabetes mellitus with diabetic chronic kidney disease: Secondary | ICD-10-CM | POA: Diagnosis not present

## 2019-07-13 DIAGNOSIS — Z882 Allergy status to sulfonamides status: Secondary | ICD-10-CM | POA: Diagnosis not present

## 2019-07-13 DIAGNOSIS — I129 Hypertensive chronic kidney disease with stage 1 through stage 4 chronic kidney disease, or unspecified chronic kidney disease: Secondary | ICD-10-CM | POA: Diagnosis not present

## 2019-07-13 DIAGNOSIS — E039 Hypothyroidism, unspecified: Secondary | ICD-10-CM | POA: Diagnosis not present

## 2019-07-13 DIAGNOSIS — E785 Hyperlipidemia, unspecified: Secondary | ICD-10-CM | POA: Diagnosis not present

## 2019-07-13 DIAGNOSIS — Z0000 Encounter for general adult medical examination without abnormal findings: Secondary | ICD-10-CM | POA: Diagnosis not present

## 2019-07-14 ENCOUNTER — Ambulatory Visit: Payer: Medicare Other | Attending: Internal Medicine

## 2019-07-14 DIAGNOSIS — Z23 Encounter for immunization: Secondary | ICD-10-CM | POA: Insufficient documentation

## 2019-07-14 NOTE — Progress Notes (Signed)
   Covid-19 Vaccination Clinic  Name:  ZYRION COEY    MRN: [id number] DOB: 24-Jan-1946  07/14/2019  Mr. Arcia was observed post Covid-19 immunization for 15 minutes without incident. He was provided with Vaccine Information Sheet and instruction to access the V-Safe system.   Mr. Manera was instructed to call 911 with any severe reactions post vaccine: Marland Kitchen Difficulty breathing  . Swelling of face and throat  . A fast heartbeat  . A bad rash all over body  . Dizziness and weakness   Immunizations Administered    Name Date Dose VIS Date Route   Pfizer COVID-19 Vaccine 07/14/2019  2:39 PM 0.3 mL 04/17/2019 Intramuscular   Manufacturer: [employer], [employer]   Lot: [id number]   NDC: [address]-6

## 2019-07-20 DIAGNOSIS — Z1212 Encounter for screening for malignant neoplasm of rectum: Secondary | ICD-10-CM | POA: Diagnosis not present

## 2019-07-20 DIAGNOSIS — Z1211 Encounter for screening for malignant neoplasm of colon: Secondary | ICD-10-CM | POA: Diagnosis not present

## 2019-07-23 LAB — COLOGUARD: COLOGUARD: NEGATIVE

## 2019-07-30 ENCOUNTER — Inpatient Hospital Stay: Admission: RE | Admit: 2019-07-30 | Payer: Medicare Other | Source: Ambulatory Visit

## 2019-08-01 ENCOUNTER — Ambulatory Visit
Admission: RE | Admit: 2019-08-01 | Discharge: 2019-08-01 | Disposition: A | Discharge status: Home / Self Care | Payer: Medicare Other | Source: Ambulatory Visit | Attending: Neurology | Admitting: Neurology

## 2019-08-01 DIAGNOSIS — F4321 Adjustment disorder with depressed mood: Secondary | ICD-10-CM

## 2019-08-01 DIAGNOSIS — R259 Unspecified abnormal involuntary movements: Secondary | ICD-10-CM

## 2019-08-01 DIAGNOSIS — R251 Tremor, unspecified: Secondary | ICD-10-CM | POA: Diagnosis not present

## 2019-08-01 DIAGNOSIS — M6289 Other specified disorders of muscle: Secondary | ICD-10-CM

## 2019-08-01 DIAGNOSIS — H519 Unspecified disorder of binocular movement: Secondary | ICD-10-CM | POA: Diagnosis not present

## 2019-08-01 MED ORDER — GADOBENATE DIMEGLUMINE 529 MG/ML IV SOLN
14.0000 mL | Freq: Once | INTRAVENOUS | Status: AC | PRN
  Administered 2019-08-01: 14 mL via INTRAVENOUS

## 2019-08-10 DIAGNOSIS — E119 Type 2 diabetes mellitus without complications: Secondary | ICD-10-CM | POA: Diagnosis not present

## 2019-08-12 NOTE — Progress Notes (Signed)
Normal Brain MRI , no acute changes. CT 2018 was available for comparison/

## 2019-08-13 ENCOUNTER — Encounter: Payer: Self-pay | Admitting: Neurology

## 2019-08-13 ENCOUNTER — Ambulatory Visit (INDEPENDENT_AMBULATORY_CARE_PROVIDER_SITE_OTHER): Payer: Medicare Other | Admitting: Neurology

## 2019-08-13 ENCOUNTER — Other Ambulatory Visit: Payer: Self-pay

## 2019-08-13 VITALS — BP 136/70 | HR 81 | Temp 97.0°F | Ht 67.0 in | Wt 158.0 lb

## 2019-08-13 DIAGNOSIS — G250 Essential tremor: Secondary | ICD-10-CM

## 2019-08-13 DIAGNOSIS — F4321 Adjustment disorder with depressed mood: Secondary | ICD-10-CM

## 2019-08-13 MED ORDER — SERTRALINE HCL 25 MG PO TABS: 25.0000 mg | ORAL_TABLET | Freq: Every day | ORAL | 2 refills | Status: DC

## 2019-08-13 NOTE — Progress Notes (Signed)
[facility]    Provider:  Melvyn Novas, MD  Primary Care Physician:  Merri Brunette, MD [address] [place] [address] [place] [address]     Referring Provider: Merri Brunette, Md [address] [place],  [place] [address]          Chief Complaint according to patient   Patient presents with:    . Re- Visit)     pt here to follow up post MRI studies      HISTORY OF PRESENT ILLNESS:    RV I have the pleasure of seeing Paul Roberts 08-13-2019, a ambidextrous - male with new onset tremors. He has Dr. Erle Crocker had undergone an MRI of the brain after he found which doctors treadmill but also eye-movement abnormalities.  The MRI of the brain was obtained on 3-20 721 and compared to a brain CT scan dated 01-24-17.  There was no loss upper levels of the spinal cord noted, pituitary gland and optic chiasm appeared normal cerebellum and brainstem for looking well.  There are no acute findings and there is a normal enhancement pattern seen after contrast agent was applied.  The ventricles are enlarged but this is to the extent of her mild generalized cortical atrophy and it is not unusual for someone in the 70s.  There has been a slight progression of this finding since 2018 and minimal chronic microvascular changes also typical for age. The patient has tried carbidopa levodopa and what I thought was a parkinsonian manifestation, he experienced visual misperceptions or hallucinations from the corner of his visual field and after discontinuing the medication this is gone.  He also did not feel that the trauma changed in any way.  His eye movements have been normalized and he is now taking sertraline again I provided her today to increase his amount of restful sleep.  I would like to follow-up in 12 months with a memory test and a gait examination and a trauma reevaluation.    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  Paul Roberts is a 74 y.o. year old african left-handed  male patient seen here upon referral by Dr. Renne Crigler on  for evaluation of new onset tremor. Chief concern according to patient : "My wife of 33 years just died unexpected this Summer- of an previously undiscovered lung cancer".    I have the pleasure of seeing Paul Roberts today, a ambidextrous - male with new onset tremors. He  has a past medical history of Diabetes mellitus without complication (HCC) and Hypertension.  Further noted are his note by endocrinology that he suffers from chronic kidney disease stage III which was already noted in June 2015 and may have progressed, diabetes mellitus with renal manifestations, essential hypertension, elevated cholesterol, hypothyroidism.  His tremor began in the left hand which is his dominant hand he is ambidextrous but the left hand is his preferred hand, this could be more typical for a Parkinson manifestation however his gait seems to be curiously unaffected he could rise without bracing himself from a seated position.  He does not appear unsteady when he walks but he does have fragmented terms he needed more than 4 steps to turn 180 degrees.  Given the cogwheeling I will definitely follow a work-up for Parkinson's disease versus normal parkinsonism for alternative explanations for tremor.  He also had significantly more tremor in the left  hand when he performs the drawing of an Archimedes spiral.  Situationally there may be an depression and anxiety input and he has stated that he does not sleep very well since his wife died.  This has been 4 or 5 very difficult months for him and the tremor progressed during that time rapidly.  I would also consider offering an antidepressant for the time helping with sleep and grieving. He noted a tremor affecting his dominant left hand about 9 month ago and now this tremor affects the right side, too. He has  difficulties raising a spoon or buttoning his shirt. Handwriting has been impaired.    Family medical history:  Unknown, his mother died when he was 6.  Social history: Patient is retired from Agricultural consultant at [employer] and lives in a household alone. Family status is widowed , with adult daughter. One older daughter lives in [place].  The patient currently works part- time. Tobacco use: smoked until 40 years ago.  ETOH use: rarely , Caffeine intake in form of Coffee( 3-4 a day) Soda( none ) Tea ( hot/ rarely) or energy drinks. Regular exercise in form of walking.   Hobbies : computers   The patient's dinner time is between 5-6  PM.  He sleeps well, he is not overweight. He reads a lot. The tremor gets worse as the day goes on, and most noticeable when lifting something heavy. He noted increase in tremor when fatigued - and has noted the tremor with age.  Family history is to spotty.  He has a son in [place] and a Engineer, maintenance (IT). Son is in the [place].     Review of Systems: Out of a complete 14 system review, the patient complains of only the following symptoms, and all other reviewed systems are negative.:  Tremor , improved by time as Grief improved .   Social History   Socioeconomic History  . Marital status: Widowed    Spouse name: Not on file  . Number of children: Not on file  . Years of education: Not on file  . Highest education level: Not on file  Occupational History  . Not on file  Tobacco Use  . Smoking status: Former Games developer  . Smokeless tobacco: Never Used  Substance and Sexual Activity  . Alcohol use: No  . Drug use: No  . Sexual activity: Not on file  Other Topics Concern  . Not on file  Social History Narrative   Right handed   Social Determinants of Health   Financial Resource Strain:   . Difficulty of Paying Living Expenses:   Food Insecurity:   . Worried About Programme researcher, broadcasting/film/video in the Last Year:   . Barista in the Last Year:     Transportation Needs:   . Freight forwarder (Medical):   Marland Kitchen Lack of Transportation (Non-Medical):   Physical Activity:   . Days of Exercise per Week:   . Minutes of Exercise per Session:   Stress:   . Feeling of Stress :   Social Connections:   . Frequency of Communication with Friends and Family:   . Frequency of Social Gatherings with Friends and Family:   . Attends Religious Services:   . Active Member of Clubs or Organizations:   . Attends Banker Meetings:   Marland Kitchen Marital Status:     No family history on file.  Past Medical History:  Diagnosis Date  . Diabetes mellitus without complication (HCC)   . Hypertension  Past Surgical History:  Procedure Laterality Date  . NO PAST SURGERIES       Current Outpatient Medications on File Prior to Visit  Medication Sig Dispense Refill  . glimepiride (AMARYL) 1 MG tablet Take 1 mg by mouth daily with breakfast.    . metFORMIN (GLUCOPHAGE-XR) 500 MG 24 hr tablet Take 500 mg by mouth 2 (two) times daily.  2  . valsartan-hydrochlorothiazide (DIOVAN-HCT) 160-25 MG per tablet Take 1 tablet by mouth at bedtime.     . carbidopa-levodopa (SINEMET IR) 25-100 MG tablet Take 1 tablet by mouth 3 (three) times daily. (Patient not taking: Reported on 08/13/2019) 60 tablet 2   No current facility-administered medications on file prior to visit.    Allergies  Allergen Reactions  . Sulfa Antibiotics Other (See Comments)    Pt denies    Physical exam:  Today's Vitals   08/13/19 1320  BP: 136/70  Pulse: 81  Temp: (!) 97 F (36.1 C)  Weight: 158 lb (71.7 kg)  Height: 5\' 7"  (1.702 m)   Body mass index is 24.75 kg/m.   Wt Readings from Last 3 Encounters:  08/13/19 158 lb (71.7 kg)  04/14/19 160 lb (72.6 kg)  01/24/17 156 lb (70.8 kg)     Ht Readings from Last 3 Encounters:  08/13/19 5\' 7"  (1.702 m)  04/14/19 5\' 7"  (1.702 m)  01/24/17 6' (1.829 m)      General: The patient is awake, alert and appears not in  acute distress. The patient is well groomed. Head: Normocephalic, atraumatic. Neck is supple.  Cardiovascular:  Regular rate and cardiac rhythm by pulse,  without distended neck veins. Respiratory: Lungs are clear to auscultation.  Skin:  Without evidence of ankle edema, or rash. Trunk: The patient's posture is erect.   Neurologic exam : The patient is awake and alert, oriented to place and time.   Memory subjective described as intact.  Attention span & concentration ability appears normal.  Speech is fluent,  without  dysarthria, dysphonia or aphasia.  Mood and affect are appropriate.   Cranial nerves: no loss of smell or taste reported  Pupils are equal and briskly reactive to light.neovascularization at the right cornea.  Funduscopic exam deferred.  Extraocular movements in vertical and horizontal planes were disturbed- he couldn't follow a moving object easily and always turned his head with the maneuver.    nystagmus? Difficulties to direct gaze horizontally, not vertically. No Diplopia. Visual field examination by finger perimetry were not sucessfull.  Hearing was intact to soft voice and finger rubbing.    Facial sensation intact to fine touch.  Facial motor strength is symmetric and tongue and uvula move midline.  Neck ROM : rotation, tilt and flexion extension were normal for age and shoulder shrug was symmetrical.    Motor exam:  Symmetric bulk, tone and ROM.   Increased muscle tone with cog wheeling, bilaterally over biceps -symmetric grip strength . Adduction, abduction and hip flexion were intact.  Sensory:  Fine touch, pinprick and vibration were normal in feet and hands. .  Proprioception tested in the upper extremities was normal. Coordination: Rapid alternating movements in the fingers/hands were of reduced  speed.  The Finger-to-nose maneuver was  with evidence of ataxia, dysmetria and close to target tremor.  Gait and station: Patient could rise unassisted from a  seated position, walked without assistive device. He appears not stooped today (!) and stride is forceful, normal width and turning in 3 steps. and his arm  swing is still present but reduced,  no pill rolling tremor was noted as he walked. .  Toe and heel walk were deferred.  Deep tendon reflexes: in the  upper and lower extremities are symmetrically attenuated-  and intact at trace.  Babinski response was deferred. Romberg negative.     After spending a total time of 15 minutes face to face and additional time for physical and neurologic examination, review of laboratory studies,  personal review of imaging studies, reports and results of other testing and review of referral information / records as far as provided in visit, I have established the following assessments:  , this could be more typical for a Parkinson manifestation however his gait seems to be curiously unaffected he could rise without bracing himself from a seated position.  He does not appear unsteady when he walks but he does have fragmented terms he needed more than 4 steps to turn 180 degrees.  Given the cogwheeling I will definitely follow a work-up for Parkinson's disease versus normal parkinsonism for alternative explanations for tremor.  He also had significantly more tremor in the left hand when he performs the drawing of an Archimedes spiral.     1)  His tremor and abnormal eye movements are much less prominent today He has less cog-wheeling, he has tremor,but great balance   He denies any falls. He has no RLS and no disinhibition, no thoughts of suicide.carbidopa did not help. He noted  Visual misperceptions- bothersome.  His tremor is mo re of an action tremor.    2)  Zoloft 25 mg nightly for sleep and anxiety,  the medication has helped- but he has not refilled.I wrote for 90 days and 2 refills today.  3) OT /  PT with neuro- rehabilitation was ordered.  Situationally there may be an depression and anxiety input and he  has stated that he does not sleep very well since his wife died. This has been 4 or 5 very difficult months for him and the tremor progressed during that time rapidly. BP was in normal range, Diabetes is well controlled.    I would like to thank Deland Pretty, MD and [address] [place],  [place] [id number] for allowing me to meet with and to take care of this pleasant patient.   In short, Paul Roberts is presenting with a now reduced  tremor affecting first and foremost his left- dominant hand.  He has d/c sinemet as it did not help.   I plan to follow up either personally within 12 month.   CC: I will share my notes with PCP   Electronically signed by: Larey Seat, MD 08/13/2019 1:49 PM  [facility] Board certified by The [employer] of Sleep Medicine and Diplomate of the [employer] of Sleep Medicine. Board certified In Neurology through the [employer], Fellow of the [employer] of Neurology. Medical Director of [facility].

## 2019-08-13 NOTE — Patient Instructions (Addendum)

## 2019-08-17 DIAGNOSIS — E1165 Type 2 diabetes mellitus with hyperglycemia: Secondary | ICD-10-CM | POA: Diagnosis not present

## 2019-08-17 DIAGNOSIS — E039 Hypothyroidism, unspecified: Secondary | ICD-10-CM | POA: Diagnosis not present

## 2019-08-17 DIAGNOSIS — N182 Chronic kidney disease, stage 2 (mild): Secondary | ICD-10-CM | POA: Diagnosis not present

## 2019-08-17 DIAGNOSIS — I129 Hypertensive chronic kidney disease with stage 1 through stage 4 chronic kidney disease, or unspecified chronic kidney disease: Secondary | ICD-10-CM | POA: Diagnosis not present

## 2019-08-17 DIAGNOSIS — E785 Hyperlipidemia, unspecified: Secondary | ICD-10-CM | POA: Diagnosis not present

## 2019-08-20 DIAGNOSIS — E785 Hyperlipidemia, unspecified: Secondary | ICD-10-CM | POA: Diagnosis not present

## 2019-09-09 DIAGNOSIS — I129 Hypertensive chronic kidney disease with stage 1 through stage 4 chronic kidney disease, or unspecified chronic kidney disease: Secondary | ICD-10-CM | POA: Diagnosis not present

## 2019-09-09 DIAGNOSIS — E785 Hyperlipidemia, unspecified: Secondary | ICD-10-CM | POA: Diagnosis not present

## 2019-09-09 DIAGNOSIS — E039 Hypothyroidism, unspecified: Secondary | ICD-10-CM | POA: Diagnosis not present

## 2019-09-09 DIAGNOSIS — E1122 Type 2 diabetes mellitus with diabetic chronic kidney disease: Secondary | ICD-10-CM | POA: Diagnosis not present

## 2019-09-28 DIAGNOSIS — E1165 Type 2 diabetes mellitus with hyperglycemia: Secondary | ICD-10-CM | POA: Diagnosis not present

## 2020-01-28 DIAGNOSIS — I129 Hypertensive chronic kidney disease with stage 1 through stage 4 chronic kidney disease, or unspecified chronic kidney disease: Secondary | ICD-10-CM | POA: Diagnosis not present

## 2020-01-28 DIAGNOSIS — E1165 Type 2 diabetes mellitus with hyperglycemia: Secondary | ICD-10-CM | POA: Diagnosis not present

## 2020-01-29 DIAGNOSIS — Z23 Encounter for immunization: Secondary | ICD-10-CM | POA: Diagnosis not present

## 2020-02-25 DIAGNOSIS — E785 Hyperlipidemia, unspecified: Secondary | ICD-10-CM | POA: Diagnosis not present

## 2020-02-25 DIAGNOSIS — E1122 Type 2 diabetes mellitus with diabetic chronic kidney disease: Secondary | ICD-10-CM | POA: Diagnosis not present

## 2020-02-25 DIAGNOSIS — E1165 Type 2 diabetes mellitus with hyperglycemia: Secondary | ICD-10-CM | POA: Diagnosis not present

## 2020-02-25 DIAGNOSIS — I129 Hypertensive chronic kidney disease with stage 1 through stage 4 chronic kidney disease, or unspecified chronic kidney disease: Secondary | ICD-10-CM | POA: Diagnosis not present

## 2020-03-10 DIAGNOSIS — E1165 Type 2 diabetes mellitus with hyperglycemia: Secondary | ICD-10-CM | POA: Diagnosis not present

## 2020-03-21 ENCOUNTER — Encounter (INDEPENDENT_AMBULATORY_CARE_PROVIDER_SITE_OTHER): Payer: Medicare Other | Admitting: Ophthalmology

## 2020-03-21 ENCOUNTER — Other Ambulatory Visit: Payer: Self-pay

## 2020-03-21 DIAGNOSIS — H33033 Retinal detachment with giant retinal tear, bilateral: Secondary | ICD-10-CM | POA: Diagnosis not present

## 2020-03-21 DIAGNOSIS — I10 Essential (primary) hypertension: Secondary | ICD-10-CM | POA: Diagnosis not present

## 2020-03-21 DIAGNOSIS — H43813 Vitreous degeneration, bilateral: Secondary | ICD-10-CM

## 2020-03-21 DIAGNOSIS — H35033 Hypertensive retinopathy, bilateral: Secondary | ICD-10-CM

## 2020-05-13 DIAGNOSIS — R351 Nocturia: Secondary | ICD-10-CM | POA: Diagnosis not present

## 2020-05-13 DIAGNOSIS — I129 Hypertensive chronic kidney disease with stage 1 through stage 4 chronic kidney disease, or unspecified chronic kidney disease: Secondary | ICD-10-CM | POA: Diagnosis not present

## 2020-06-10 DIAGNOSIS — I10 Essential (primary) hypertension: Secondary | ICD-10-CM | POA: Diagnosis not present

## 2020-06-10 DIAGNOSIS — G252 Other specified forms of tremor: Secondary | ICD-10-CM | POA: Diagnosis not present

## 2020-06-29 DIAGNOSIS — E1165 Type 2 diabetes mellitus with hyperglycemia: Secondary | ICD-10-CM | POA: Diagnosis not present

## 2020-07-06 DIAGNOSIS — Z882 Allergy status to sulfonamides status: Secondary | ICD-10-CM | POA: Diagnosis not present

## 2020-07-06 DIAGNOSIS — R251 Tremor, unspecified: Secondary | ICD-10-CM | POA: Diagnosis not present

## 2020-07-06 DIAGNOSIS — N182 Chronic kidney disease, stage 2 (mild): Secondary | ICD-10-CM | POA: Diagnosis not present

## 2020-07-06 DIAGNOSIS — J069 Acute upper respiratory infection, unspecified: Secondary | ICD-10-CM | POA: Diagnosis not present

## 2020-07-06 DIAGNOSIS — E785 Hyperlipidemia, unspecified: Secondary | ICD-10-CM | POA: Diagnosis not present

## 2020-07-06 DIAGNOSIS — E1165 Type 2 diabetes mellitus with hyperglycemia: Secondary | ICD-10-CM | POA: Diagnosis not present

## 2020-07-06 DIAGNOSIS — J4521 Mild intermittent asthma with (acute) exacerbation: Secondary | ICD-10-CM | POA: Diagnosis not present

## 2020-07-06 DIAGNOSIS — G252 Other specified forms of tremor: Secondary | ICD-10-CM | POA: Diagnosis not present

## 2020-07-06 DIAGNOSIS — E1122 Type 2 diabetes mellitus with diabetic chronic kidney disease: Secondary | ICD-10-CM | POA: Diagnosis not present

## 2020-07-06 DIAGNOSIS — I129 Hypertensive chronic kidney disease with stage 1 through stage 4 chronic kidney disease, or unspecified chronic kidney disease: Secondary | ICD-10-CM | POA: Diagnosis not present

## 2020-07-06 DIAGNOSIS — I10 Essential (primary) hypertension: Secondary | ICD-10-CM | POA: Diagnosis not present

## 2020-07-06 DIAGNOSIS — E039 Hypothyroidism, unspecified: Secondary | ICD-10-CM | POA: Diagnosis not present

## 2020-07-12 DIAGNOSIS — E1165 Type 2 diabetes mellitus with hyperglycemia: Secondary | ICD-10-CM | POA: Diagnosis not present

## 2020-07-12 DIAGNOSIS — Z125 Encounter for screening for malignant neoplasm of prostate: Secondary | ICD-10-CM | POA: Diagnosis not present

## 2020-07-12 DIAGNOSIS — Z8639 Personal history of other endocrine, nutritional and metabolic disease: Secondary | ICD-10-CM | POA: Diagnosis not present

## 2020-07-12 DIAGNOSIS — I10 Essential (primary) hypertension: Secondary | ICD-10-CM | POA: Diagnosis not present

## 2020-07-19 DIAGNOSIS — E118 Type 2 diabetes mellitus with unspecified complications: Secondary | ICD-10-CM | POA: Diagnosis not present

## 2020-07-19 DIAGNOSIS — E1165 Type 2 diabetes mellitus with hyperglycemia: Secondary | ICD-10-CM | POA: Diagnosis not present

## 2020-07-19 DIAGNOSIS — Z0000 Encounter for general adult medical examination without abnormal findings: Secondary | ICD-10-CM | POA: Diagnosis not present

## 2020-07-19 DIAGNOSIS — J4521 Mild intermittent asthma with (acute) exacerbation: Secondary | ICD-10-CM | POA: Diagnosis not present

## 2020-07-19 DIAGNOSIS — Z882 Allergy status to sulfonamides status: Secondary | ICD-10-CM | POA: Diagnosis not present

## 2020-07-19 DIAGNOSIS — Z23 Encounter for immunization: Secondary | ICD-10-CM | POA: Diagnosis not present

## 2020-07-19 DIAGNOSIS — E785 Hyperlipidemia, unspecified: Secondary | ICD-10-CM | POA: Diagnosis not present

## 2020-07-19 DIAGNOSIS — I10 Essential (primary) hypertension: Secondary | ICD-10-CM | POA: Diagnosis not present

## 2020-07-19 DIAGNOSIS — G252 Other specified forms of tremor: Secondary | ICD-10-CM | POA: Diagnosis not present

## 2020-07-20 ENCOUNTER — Other Ambulatory Visit: Payer: Self-pay | Admitting: Internal Medicine

## 2020-07-20 DIAGNOSIS — E785 Hyperlipidemia, unspecified: Secondary | ICD-10-CM

## 2020-08-30 DIAGNOSIS — E039 Hypothyroidism, unspecified: Secondary | ICD-10-CM | POA: Diagnosis not present

## 2020-09-05 DIAGNOSIS — E039 Hypothyroidism, unspecified: Secondary | ICD-10-CM | POA: Diagnosis not present

## 2020-09-05 DIAGNOSIS — E118 Type 2 diabetes mellitus with unspecified complications: Secondary | ICD-10-CM | POA: Diagnosis not present

## 2020-09-05 DIAGNOSIS — I10 Essential (primary) hypertension: Secondary | ICD-10-CM | POA: Diagnosis not present

## 2020-09-07 DIAGNOSIS — E119 Type 2 diabetes mellitus without complications: Secondary | ICD-10-CM | POA: Diagnosis not present

## 2020-09-16 DIAGNOSIS — G250 Essential tremor: Secondary | ICD-10-CM | POA: Diagnosis not present

## 2020-10-17 DIAGNOSIS — E118 Type 2 diabetes mellitus with unspecified complications: Secondary | ICD-10-CM | POA: Diagnosis not present

## 2020-10-27 DIAGNOSIS — E1165 Type 2 diabetes mellitus with hyperglycemia: Secondary | ICD-10-CM | POA: Diagnosis not present

## 2020-12-21 DIAGNOSIS — G250 Essential tremor: Secondary | ICD-10-CM | POA: Diagnosis not present

## 2020-12-21 DIAGNOSIS — J4521 Mild intermittent asthma with (acute) exacerbation: Secondary | ICD-10-CM | POA: Insufficient documentation

## 2020-12-21 DIAGNOSIS — E039 Hypothyroidism, unspecified: Secondary | ICD-10-CM | POA: Insufficient documentation

## 2020-12-21 DIAGNOSIS — E785 Hyperlipidemia, unspecified: Secondary | ICD-10-CM | POA: Diagnosis present

## 2021-01-16 DIAGNOSIS — E1165 Type 2 diabetes mellitus with hyperglycemia: Secondary | ICD-10-CM | POA: Diagnosis not present

## 2021-01-16 DIAGNOSIS — I10 Essential (primary) hypertension: Secondary | ICD-10-CM | POA: Diagnosis not present

## 2021-01-16 DIAGNOSIS — E785 Hyperlipidemia, unspecified: Secondary | ICD-10-CM | POA: Diagnosis not present

## 2021-01-25 DIAGNOSIS — E118 Type 2 diabetes mellitus with unspecified complications: Secondary | ICD-10-CM | POA: Diagnosis not present

## 2021-02-17 DIAGNOSIS — Z23 Encounter for immunization: Secondary | ICD-10-CM | POA: Diagnosis not present

## 2021-03-17 DIAGNOSIS — N39498 Other specified urinary incontinence: Secondary | ICD-10-CM | POA: Diagnosis not present

## 2021-03-17 DIAGNOSIS — R319 Hematuria, unspecified: Secondary | ICD-10-CM | POA: Diagnosis not present

## 2021-03-17 DIAGNOSIS — E1165 Type 2 diabetes mellitus with hyperglycemia: Secondary | ICD-10-CM | POA: Diagnosis not present

## 2021-03-22 ENCOUNTER — Encounter (INDEPENDENT_AMBULATORY_CARE_PROVIDER_SITE_OTHER): Payer: Medicare Other | Admitting: Ophthalmology

## 2021-03-22 ENCOUNTER — Other Ambulatory Visit: Payer: Self-pay

## 2021-03-22 DIAGNOSIS — H33303 Unspecified retinal break, bilateral: Secondary | ICD-10-CM | POA: Diagnosis not present

## 2021-03-22 DIAGNOSIS — I10 Essential (primary) hypertension: Secondary | ICD-10-CM

## 2021-03-22 DIAGNOSIS — H35033 Hypertensive retinopathy, bilateral: Secondary | ICD-10-CM | POA: Diagnosis not present

## 2021-03-22 DIAGNOSIS — H43813 Vitreous degeneration, bilateral: Secondary | ICD-10-CM | POA: Diagnosis not present

## 2021-04-04 DIAGNOSIS — E118 Type 2 diabetes mellitus with unspecified complications: Secondary | ICD-10-CM | POA: Diagnosis not present

## 2021-04-04 DIAGNOSIS — E039 Hypothyroidism, unspecified: Secondary | ICD-10-CM | POA: Diagnosis not present

## 2021-04-18 ENCOUNTER — Encounter (HOSPITAL_COMMUNITY): Payer: Self-pay | Admitting: Emergency Medicine

## 2021-04-18 ENCOUNTER — Other Ambulatory Visit: Payer: Self-pay

## 2021-04-18 ENCOUNTER — Inpatient Hospital Stay (HOSPITAL_COMMUNITY)
Admission: EM | Admit: 2021-04-18 | Discharge: 2021-04-20 | DRG: 176 | Disposition: A | Discharge status: Home / Self Care | Payer: Medicare Other | Attending: Internal Medicine | Admitting: Internal Medicine

## 2021-04-18 ENCOUNTER — Emergency Department (HOSPITAL_COMMUNITY): Payer: Medicare Other

## 2021-04-18 DIAGNOSIS — I10 Essential (primary) hypertension: Secondary | ICD-10-CM

## 2021-04-18 DIAGNOSIS — F32A Depression, unspecified: Secondary | ICD-10-CM | POA: Diagnosis present

## 2021-04-18 DIAGNOSIS — E119 Type 2 diabetes mellitus without complications: Secondary | ICD-10-CM

## 2021-04-18 DIAGNOSIS — R059 Cough, unspecified: Secondary | ICD-10-CM | POA: Diagnosis not present

## 2021-04-18 DIAGNOSIS — Z20822 Contact with and (suspected) exposure to covid-19: Secondary | ICD-10-CM | POA: Diagnosis present

## 2021-04-18 DIAGNOSIS — R778 Other specified abnormalities of plasma proteins: Secondary | ICD-10-CM | POA: Diagnosis not present

## 2021-04-18 DIAGNOSIS — Z888 Allergy status to other drugs, medicaments and biological substances status: Secondary | ICD-10-CM

## 2021-04-18 DIAGNOSIS — I2694 Multiple subsegmental pulmonary emboli without acute cor pulmonale: Secondary | ICD-10-CM

## 2021-04-18 DIAGNOSIS — Z882 Allergy status to sulfonamides status: Secondary | ICD-10-CM | POA: Diagnosis not present

## 2021-04-18 DIAGNOSIS — Z87891 Personal history of nicotine dependence: Secondary | ICD-10-CM | POA: Diagnosis not present

## 2021-04-18 DIAGNOSIS — M79651 Pain in right thigh: Secondary | ICD-10-CM | POA: Diagnosis present

## 2021-04-18 DIAGNOSIS — Z7984 Long term (current) use of oral hypoglycemic drugs: Secondary | ICD-10-CM

## 2021-04-18 DIAGNOSIS — E1165 Type 2 diabetes mellitus with hyperglycemia: Secondary | ICD-10-CM | POA: Diagnosis present

## 2021-04-18 DIAGNOSIS — R7989 Other specified abnormal findings of blood chemistry: Secondary | ICD-10-CM

## 2021-04-18 DIAGNOSIS — E876 Hypokalemia: Secondary | ICD-10-CM | POA: Diagnosis not present

## 2021-04-18 DIAGNOSIS — Z881 Allergy status to other antibiotic agents status: Secondary | ICD-10-CM

## 2021-04-18 DIAGNOSIS — I2699 Other pulmonary embolism without acute cor pulmonale: Principal | ICD-10-CM | POA: Diagnosis present

## 2021-04-18 DIAGNOSIS — Z79899 Other long term (current) drug therapy: Secondary | ICD-10-CM | POA: Diagnosis not present

## 2021-04-18 DIAGNOSIS — M79652 Pain in left thigh: Secondary | ICD-10-CM | POA: Diagnosis present

## 2021-04-18 DIAGNOSIS — R079 Chest pain, unspecified: Secondary | ICD-10-CM | POA: Diagnosis not present

## 2021-04-18 DIAGNOSIS — R0600 Dyspnea, unspecified: Secondary | ICD-10-CM | POA: Diagnosis not present

## 2021-04-18 DIAGNOSIS — I2609 Other pulmonary embolism with acute cor pulmonale: Secondary | ICD-10-CM | POA: Diagnosis not present

## 2021-04-18 DIAGNOSIS — Z794 Long term (current) use of insulin: Secondary | ICD-10-CM | POA: Diagnosis not present

## 2021-04-18 LAB — CBC
HCT: 43.1 % (ref 39.0–52.0)
Hemoglobin: 14.4 g/dL (ref 13.0–17.0)
MCH: 27.7 pg (ref 26.0–34.0)
MCHC: 33.4 g/dL (ref 30.0–36.0)
MCV: 83 fL (ref 80.0–100.0)
Platelets: 239 10*3/uL (ref 150–400)
RBC: 5.19 MIL/uL (ref 4.22–5.81)
RDW: 12.9 % (ref 11.5–15.5)
WBC: 6.8 10*3/uL (ref 4.0–10.5)
nRBC: 0 % (ref 0.0–0.2)

## 2021-04-18 LAB — URINALYSIS, MICROSCOPIC (REFLEX): Bacteria, UA: NONE SEEN

## 2021-04-18 LAB — BASIC METABOLIC PANEL
Anion gap: 10 (ref 5–15)
BUN: 15 mg/dL (ref 8–23)
CO2: 27 mmol/L (ref 22–32)
Calcium: 9.5 mg/dL (ref 8.9–10.3)
Chloride: 98 mmol/L (ref 98–111)
Creatinine, Ser: 1.22 mg/dL (ref 0.61–1.24)
GFR, Estimated: >60 mL/min (ref >60)
Glucose, Bld: 346 mg/dL — ABNORMAL HIGH (ref 70–99)
Potassium: 3.2 mmol/L — ABNORMAL LOW (ref 3.5–5.1)
Sodium: 135 mmol/L (ref 135–145)

## 2021-04-18 LAB — CBG MONITORING, ED: Glucose-Capillary: 220 mg/dL — ABNORMAL HIGH (ref 70–99)

## 2021-04-18 LAB — RESP PANEL BY RT-PCR (FLU A&B, COVID) ARPGX2
Influenza A by PCR: NEGATIVE
Influenza B by PCR: NEGATIVE
SARS Coronavirus 2 by RT PCR: NEGATIVE

## 2021-04-18 LAB — URINALYSIS, ROUTINE W REFLEX MICROSCOPIC
Bilirubin Urine: NEGATIVE
Glucose, UA: >=500 mg/dL — AB
Hgb urine dipstick: NEGATIVE
Ketones, ur: NEGATIVE mg/dL
Leukocytes,Ua: NEGATIVE
Nitrite: NEGATIVE
Protein, ur: NEGATIVE mg/dL
Specific Gravity, Urine: >1.03 — ABNORMAL HIGH (ref 1.005–1.030)
pH: 5.5 (ref 5.0–8.0)

## 2021-04-18 LAB — PROTIME-INR
INR: 1 (ref 0.8–1.2)
Prothrombin Time: 13.1 seconds (ref 11.4–15.2)

## 2021-04-18 LAB — CK: Total CK: 165 U/L (ref 49–397)

## 2021-04-18 LAB — D-DIMER, QUANTITATIVE: D-Dimer, Quant: 3.75 ug/mL-FEU — ABNORMAL HIGH (ref 0.00–0.50)

## 2021-04-18 LAB — TROPONIN I (HIGH SENSITIVITY)
Troponin I (High Sensitivity): 27 ng/L — ABNORMAL HIGH (ref <18)
Troponin I (High Sensitivity): 27 ng/L — ABNORMAL HIGH (ref <18)

## 2021-04-18 MED ORDER — INSULIN ASPART 100 UNIT/ML IJ SOLN
0.0000 [IU] | Freq: Every day | INTRAMUSCULAR | Status: DC
  Administered 2021-04-18: 2 [IU] via SUBCUTANEOUS

## 2021-04-18 MED ORDER — POTASSIUM CHLORIDE CRYS ER 20 MEQ PO TBCR
40.0000 meq | EXTENDED_RELEASE_TABLET | Freq: Once | ORAL | Status: AC
  Administered 2021-04-18: 40 meq via ORAL
  Filled 2021-04-18: qty 2

## 2021-04-18 MED ORDER — IOHEXOL 350 MG/ML SOLN
70.0000 mL | Freq: Once | INTRAVENOUS | Status: AC | PRN
  Administered 2021-04-18: 70 mL via INTRAVENOUS

## 2021-04-18 MED ORDER — ACETAMINOPHEN 325 MG PO TABS: 650.0000 mg | ORAL_TABLET | Freq: Four times a day (QID) | ORAL | Status: DC | PRN

## 2021-04-18 MED ORDER — HEPARIN (PORCINE) 25000 UT/250ML-% IV SOLN
1400.0000 [IU]/h | INTRAVENOUS | Status: DC
  Administered 2021-04-18: 1300 [IU]/h via INTRAVENOUS
  Filled 2021-04-18 (×3): qty 250

## 2021-04-18 MED ORDER — INSULIN ASPART 100 UNIT/ML IJ SOLN
0.0000 [IU] | Freq: Three times a day (TID) | INTRAMUSCULAR | Status: DC
  Administered 2021-04-19 (×2): 7 [IU] via SUBCUTANEOUS
  Administered 2021-04-20: 1 [IU] via SUBCUTANEOUS

## 2021-04-18 MED ORDER — HEPARIN BOLUS VIA INFUSION
5500.0000 [IU] | Freq: Once | INTRAVENOUS | Status: AC
  Administered 2021-04-18: 5500 [IU] via INTRAVENOUS
  Filled 2021-04-18: qty 5500

## 2021-04-18 MED ORDER — ACETAMINOPHEN 650 MG RE SUPP: 650.0000 mg | Freq: Four times a day (QID) | RECTAL | Status: DC | PRN

## 2021-04-18 NOTE — ED Notes (Signed)
Pt given [place] sandwich per Criss Alvine MD

## 2021-04-18 NOTE — ED Notes (Signed)
Per Criss Alvine MD, pt agreeable to CT scan and placement of a new IV. RN went into room to start new IV, pt angry, saying we are taking too long. CT tech entered room to transport pt. Pt saying we didn't answer him, RN explained that myself, Paramedic Tobi Bastos, Nurse Alexa, and NT Jesus had been in the room to explain that he was waiting for CT. Pt stated "if you're here, then I don't need these other 2" (pt pointing to Paramedic and Nurse). RN explained that I was orienting both of them, pt stated "that's why you've been ignoring me, because you're training. Train them on a different day, not when I'm here." RN Alexa stated that the pt cannot speak to [place] like that, or tell us when we can train staff. This RN walked out of room. Criss Alvine MD aware.

## 2021-04-18 NOTE — ED Provider Notes (Signed)
[name] EMERGENCY DEPARTMENT Provider Note   CSN: [id number] Arrival date & time: 04/18/21  1248     History Chief Complaint  Patient presents with   Shortness of Breath    Rad Paul Roberts is a 75 y.o. male.  HPI 75 year old male presents with cough and shortness of breath and chest pain.  The symptoms have been for about 4 or 5 days.  About 6 or 7 days ago he got a flu shot and attributes his symptoms to that.  No fevers.  He is coughing up brown sputum.  He is also been having about 2 weeks of bilateral thigh pain, mostly right sided inner thigh since falling a couple weeks ago.  The chest pain seems to come and go and is mostly with coughing.  No leg swelling or abdominal pain.  Past Medical History:  Diagnosis Date   Diabetes mellitus without complication (HCC)    Hypertension     Patient Active Problem List   Diagnosis Date Noted   Pulmonary embolism (HCC) 04/18/2021   Elevated troponin 04/18/2021   Hypokalemia 04/18/2021   Diabetes (HCC) 04/18/2021   Essential hypertension 04/18/2021   Grief reaction 08/13/2019   Benign essential tremor 08/13/2019   Tremor, coarse 04/14/2019   Abnormal increased muscle tone 04/14/2019   Tremor of left hand 04/14/2019   Eye movement abnormality 04/14/2019   Unresolved grief 04/14/2019   Mixed action and resting tremor 04/14/2019    Past Surgical History:  Procedure Laterality Date   NO PAST SURGERIES         History reviewed. No pertinent family history.  Social History   Tobacco Use   Smoking status: Former   Smokeless tobacco: Never  Building services engineer Use: Never used  Substance Use Topics   Alcohol use: No   Drug use: No    Home Medications Prior to Admission medications   Medication Sig Start Date End Date Taking? Authorizing Provider  glimepiride (AMARYL) 1 MG tablet Take 1 mg by mouth daily with breakfast.    [provider]  metFORMIN (GLUCOPHAGE-XR) 500 MG 24 hr tablet  Take 500 mg by mouth 2 (two) times daily. 09/27/17   [provider]  sertraline (ZOLOFT) 25 MG tablet Take 1 tablet (25 mg total) by mouth daily. 08/13/19   Dohmeier, Porfirio Mylar, MD  valsartan-hydrochlorothiazide (DIOVAN-HCT) 160-25 MG per tablet Take 1 tablet by mouth at bedtime.     [provider]    Allergies    Sulfa antibiotics  Review of Systems   Review of Systems  Respiratory:  Positive for cough and shortness of breath.   Cardiovascular:  Positive for chest pain. Negative for leg swelling.  Gastrointestinal:  Negative for abdominal pain.  Musculoskeletal:  Positive for myalgias.  All other systems reviewed and are negative.  Physical Exam Updated Vital Signs BP (!) 158/88    Pulse 76    Temp 97.9 F (36.6 C)    Resp (!) 23    Ht 5\' 7"  (1.702 m)    Wt 73.5 kg    SpO2 90%    BMI 25.37 kg/m   Physical Exam Vitals and nursing note reviewed.  Constitutional:      Appearance: He is well-developed.  HENT:     Head: Normocephalic and atraumatic.     Right Ear: External ear normal.     Left Ear: External ear normal.     Nose: Nose normal.  Eyes:     General:  Right eye: No discharge.        Left eye: No discharge.  Cardiovascular:     Rate and Rhythm: Normal rate and regular rhythm.     Heart sounds: Normal heart sounds.  Pulmonary:     Effort: Pulmonary effort is normal.     Breath sounds: Normal breath sounds. No wheezing or rales.  Abdominal:     Palpations: Abdomen is soft.     Tenderness: There is no abdominal tenderness.  Musculoskeletal:     Cervical back: Neck supple.     Right lower leg: No edema.     Left lower leg: No edema.     Comments: Perhaps mild tenderness to the right medial thigh.  Nonfocal.  No swelling.  He is able to ambulate without difficulty.  Skin:    General: Skin is warm and dry.  Neurological:     Mental Status: He is alert.  Psychiatric:        Mood and Affect: Mood is not anxious.    ED Results / Procedures /  Treatments   Labs (all labs ordered are listed, but only abnormal results are displayed) Labs Reviewed  BASIC METABOLIC PANEL - Abnormal; Notable for the following components:      Result Value   Potassium 3.2 (*)    Glucose, Bld 346 (*)    All other components within normal limits  URINALYSIS, ROUTINE W REFLEX MICROSCOPIC - Abnormal; Notable for the following components:   Specific Gravity, Urine >1.030 (*)    Glucose, UA >=500 (*)    All other components within normal limits  D-DIMER, QUANTITATIVE - Abnormal; Notable for the following components:   D-Dimer, Quant 3.75 (*)    All other components within normal limits  CBG MONITORING, ED - Abnormal; Notable for the following components:   Glucose-Capillary 220 (*)    All other components within normal limits  TROPONIN I (HIGH SENSITIVITY) - Abnormal; Notable for the following components:   Troponin I (High Sensitivity) 27 (*)    All other components within normal limits  TROPONIN I (HIGH SENSITIVITY) - Abnormal; Notable for the following components:   Troponin I (High Sensitivity) 27 (*)    All other components within normal limits  RESP PANEL BY RT-PCR (FLU A&B, COVID) ARPGX2  CBC  URINALYSIS, MICROSCOPIC (REFLEX)  CK  PROTIME-INR  HEPARIN LEVEL (UNFRACTIONATED)  BRAIN NATRIURETIC PEPTIDE  POTASSIUM  MAGNESIUM  HEMOGLOBIN A1C    EKG EKG Interpretation  Date/Time:  Tuesday April 18 2021 12:55:30 EST Ventricular Rate:  96 PR Interval:  134 QRS Duration: 130 QT Interval:  382 QTC Calculation: 482 R Axis:   58 Text Interpretation: Normal sinus rhythm with sinus arrhythmia Right bundle branch block T wave abnormality, consider inferior ischemia Abnormal ECG Confirmed by Pricilla Loveless [phone]) on 04/18/2021 5:11:10 PM  Radiology DG Chest 2 View  Result Date: 04/18/2021 CLINICAL DATA:  Cough dyspnea EXAM: CHEST - 2 VIEW COMPARISON:  10/12/2017 FINDINGS: Cardiac size upper limits of normal. Both lungs are clear. The  visualized skeletal structures are unremarkable. IMPRESSION: No active cardiopulmonary disease. Electronically Signed   By: Jasmine Pang M.D.   On: 04/18/2021 18:12   CT Angio Chest PE W and/or Wo Contrast  Result Date: 04/18/2021 CLINICAL DATA:  Cough, lower back pain EXAM: CT ANGIOGRAPHY CHEST WITH CONTRAST TECHNIQUE: Multidetector CT imaging of the chest was performed using the standard protocol during bolus administration of intravenous contrast. Multiplanar CT image reconstructions and MIPs were obtained to evaluate  the vascular anatomy. CONTRAST:  36mL OMNIPAQUE IOHEXOL 350 MG/ML SOLN COMPARISON:  04/18/2021 FINDINGS: Cardiovascular: This is a technically adequate evaluation of the pulmonary vasculature. There are bilateral segmental pulmonary emboli, with moderate clot burden. Right ventricular dilatation, with RV/LV ratio measuring 1.2, consistent with right heart strain. No pericardial effusion. No evidence of thoracic aortic aneurysm or dissection. Mediastinum/Nodes: No enlarged mediastinal, hilar, or axillary lymph nodes. Thyroid gland, trachea, and esophagus demonstrate no significant findings. Lungs/Pleura: No acute airspace disease, effusion, or pneumothorax. Minimal dependent hypoventilatory changes within the left lower lobe. Central airways are patent. Upper Abdomen: No acute abnormality. Musculoskeletal: No acute or destructive bony lesions. Reconstructed images demonstrate no additional findings. Review of the MIP images confirms the above findings. IMPRESSION: 1. Bilateral segmental pulmonary emboli with CT evidence of right heart strain (RV/LV Ratio = 1.2) consistent with at least submassive (intermediate risk) PE. The presence of right heart strain has been associated with an increased risk of morbidity and mortality. Please refer to the "PE Focused" order set in EPIC. 2. Critical Value/emergent results were called by telephone at the time of interpretation on 04/18/2021 at 9:11 pm to  provider Pricilla Loveless , who verbally acknowledged these results. Electronically Signed   By: Sharlet Salina M.D.   On: 04/18/2021 21:11    Procedures .Critical Care Performed by: Pricilla Loveless, MD Authorized by: Pricilla Loveless, MD   Critical care provider statement:    Critical care time (minutes):  35   Critical care time was exclusive of:  Separately billable procedures and treating other patients   Critical care was necessary to treat or prevent imminent or life-threatening deterioration of the following conditions:  Respiratory failure, circulatory failure and cardiac failure   Critical care was time spent personally by me on the following activities:  Development of treatment plan with patient or surrogate, discussions with consultants, evaluation of patient's response to treatment, examination of patient, ordering and review of laboratory studies, ordering and review of radiographic studies, ordering and performing treatments and interventions, pulse oximetry, re-evaluation of patient's condition and review of old charts   Medications Ordered in ED Medications  heparin ADULT infusion 100 units/mL (25000 units/275mL) (1,300 Units/hr Intravenous New Bag/Given 04/18/21 2132)  acetaminophen (TYLENOL) tablet 650 mg (has no administration in time range)    Or  acetaminophen (TYLENOL) suppository 650 mg (has no administration in time range)  insulin aspart (novoLOG) injection 0-9 Units (has no administration in time range)  insulin aspart (novoLOG) injection 0-5 Units (2 Units Subcutaneous Given 04/18/21 2230)  potassium chloride SA (KLOR-CON M) CR tablet 40 mEq (40 mEq Oral Given 04/18/21 1811)  iohexol (OMNIPAQUE) 350 MG/ML injection 70 mL (70 mLs Intravenous Contrast Given 04/18/21 2103)  heparin bolus via infusion 5,500 Units (5,500 Units Intravenous Bolus from Bag 04/18/21 2132)    ED Course  I have reviewed the triage vital signs and the nursing notes.  Pertinent labs & imaging  results that were available during my care of the patient were reviewed by me and considered in my medical decision making (see chart for details).    MDM Rules/Calculators/A&P                           Patient's work-up ultimately shows multiple bilateral pulmonary emboli.  He has evidence of right heart strain.  He is not ill and has no hypoxia, hypotension.  He will be started on IV heparin.  I suspect that the trauma a  couple weeks ago led to a blood clot.  At this time and I we are unable to get an ultrasound of his leg but he will be started on heparin and admitted to the hospitalist service.  Low level troponins are likely secondary to the PE rather than an acute cardiac disease. Final Clinical Impression(s) / ED Diagnoses Final diagnoses:  Acute pulmonary embolism, unspecified pulmonary embolism type, unspecified whether acute cor pulmonale present [facility])    Rx / DC Orders ED Discharge Orders     None        Pricilla Loveless, MD 04/18/21 2318

## 2021-04-18 NOTE — Progress Notes (Signed)
ANTICOAGULATION CONSULT NOTE - Initial Consult  Pharmacy Consult for Heparin Indication: pulmonary embolus  Allergies  Allergen Reactions   Sulfa Antibiotics Other (See Comments)    Pt denies    Patient Measurements: Height: 5\' 7"  (170.2 cm) Weight: 73.5 kg (162 lb) IBW/kg (Calculated) : 66.1 Heparin Dosing Weight: 73.5 kg  Vital Signs: Temp: 97.9 F (36.6 C) (12/13 1609) Temp Source: Oral (12/13 1255) BP: 158/88 (12/13 1800) Pulse Rate: 76 (12/13 1800)  Labs: Recent Labs    04/18/21 1302 04/18/21 1721 04/18/21 1943  HGB 14.4  --   --   HCT 43.1  --   --   PLT 239  --   --   CREATININE 1.22  --   --   CKTOTAL  --  165  --   TROPONINIHS  --  27* 27*    Estimated Creatinine Clearance: 49.7 mL/min (by C-G formula based on SCr of 1.22 mg/dL).   Medical History: Past Medical History:  Diagnosis Date   Diabetes mellitus without complication (HCC)    Hypertension     Medications:  (Not in a hospital admission)  Scheduled:  Infusions:  PRN:   Assessment: 53 yom with a history of DM and HTN. Patient is presenting with SOB. Heparin per pharmacy consult placed for pulmonary embolus.  CT PE w/ Bilateral segmental pulmonary emboli w/ CT evidence of RHS.  Patient is not on anticoagulation prior to arrival.  Hgb 14.4; plt 239  Goal of Therapy:  Heparin level 0.3-0.7 units/ml Monitor platelets by anticoagulation protocol: Yes   Plan:  Give 5500 units bolus x 1 Start heparin infusion at 1300 units/hr Check anti-Xa level in 6 hours and daily while on heparin Continue to monitor H&H and platelets  67, PharmD, BCPS 04/18/2021 9:15 PM ED Clinical Pharmacist -  [phone]

## 2021-04-18 NOTE — ED Provider Notes (Addendum)
Emergency Medicine Provider Triage Evaluation Note  Paul Roberts , a 75 y.o. male  was evaluated in triage.  Pt complains of shortness of breath, chest pain of 1 week duration.  Patient reports this started after getting his flu shot last week.  He also endorses fall that occurred 2 weeks ago.  He denies loss of consciousness during this fall states he fell because of lightheadedness.  He reports low back pain which has been ongoing now for a couple months.  He denies fever, issues with his bowel, bladder control, lower extremity paresthesias, or history of IV drug use..  Review of Systems  Positive: See above Negative: See above  Physical Exam  BP (!) 158/87 (BP Location: Left Arm)    Pulse 93    Temp 97.7 F (36.5 C) (Oral)    Resp 18    Ht 5\' 7"  (1.702 m)    Wt 73.5 kg    SpO2 96%    BMI 25.37 kg/m  Gen:   Awake, no distress   Resp:  Normal effort  MSK:   Moves extremities without difficulty  Other:    Medical Decision Making  Medically screening exam initiated at 1:40 PM.  Appropriate orders placed.  Paul Roberts was informed that the remainder of the evaluation will be completed by another provider, this initial triage assessment does not replace that evaluation, and the importance of remaining in the ED until their evaluation is complete.     Tanna Furry, PA-C 04/18/21 1341    04/20/21, PA-C 04/18/21 1342    04/20/21, MD 04/18/21 1535

## 2021-04-18 NOTE — ED Notes (Signed)
Patient transported to X-ray 

## 2021-04-18 NOTE — ED Triage Notes (Signed)
Patient reports shortness of breath with cough and feeling off balance when walking since getting his flu shot last week. Pt c/o lower back pain. No neuro deficits noted. Tremors in both hands present- pt states have been there about a year.

## 2021-04-18 NOTE — H&P (Signed)
History and Physical    Paul Roberts INO:[id number] DOB: 04-01-1946 DOA: 04/18/2021  PCP: Merri Brunette, MD Patient coming from: Home  Chief Complaint: Shortness of breath  HPI: Paul Roberts is a 75 y.o. male with medical history significant of non-insulin-dependent type II diabetes, hypertension, depression, tremors/ eye movement abnormalities/cogwheeling concerning for Parkinson's disease versus normal parkinsonism followed by Dr. Vickey Huger from neurology.  He presents to the ED today complaining of shortness of breath, cough, and chest pain.  Not febrile, tachycardic, or hypoxic.  Labs showing no leukocytosis or anemia.  Potassium 3.2.  Blood glucose 346.  Bicarb 27, anion gap 10.  UA without ketones.  COVID and influenza PCR negative.  D-dimer 3.75.  EKG showing new T wave abnormality in inferior and lateral leads.  High-sensitivity troponin 27> 27.  CK normal.  CT angiogram chest showing bilateral segmental PE with CT evidence of right heart strain (RV/LV ratio = 1.2) consistent with at least submassive (intermediate risk) PE. Patient was given oral potassium 40 mEq and started on IV heparin.  Patient states he received flu shot a week ago.  About 3 or 4 days ago he started experiencing shortness of breath, cough, and substernal chest pain.  He did go to [place] for a funeral but his symptoms started prior to his trip.  States about a week ago he was walking on a sidewalk and fell onto his right side on the grass.  After the fall, he started noticing that his right leg was swollen but not painful.  He did not injure his head or sustain any other injuries from the fall.  He denies personal or family history of blood clots.  Review of Systems:  All systems reviewed and apart from history of presenting illness, are negative.  Past Medical History:  Diagnosis Date   Diabetes mellitus without complication (HCC)    Hypertension     Past Surgical History:  Procedure Laterality Date   NO  PAST SURGERIES       reports that he has quit smoking. He has never used smokeless tobacco. He reports that he does not drink alcohol and does not use drugs.  Allergies  Allergen Reactions   Sulfa Antibiotics Other (See Comments)    Pt denies    History reviewed. No pertinent family history.  Prior to Admission medications   Medication Sig Start Date End Date Taking? Authorizing Provider  glimepiride (AMARYL) 1 MG tablet Take 1 mg by mouth daily with breakfast.    [provider]  metFORMIN (GLUCOPHAGE-XR) 500 MG 24 hr tablet Take 500 mg by mouth 2 (two) times daily. 09/27/17   [provider]  sertraline (ZOLOFT) 25 MG tablet Take 1 tablet (25 mg total) by mouth daily. 08/13/19   Dohmeier, Porfirio Mylar, MD  valsartan-hydrochlorothiazide (DIOVAN-HCT) 160-25 MG per tablet Take 1 tablet by mouth at bedtime.     [provider]    Physical Exam: Vitals:   04/18/21 1255 04/18/21 1300 04/18/21 1609 04/18/21 1800  BP: (!) 158/87  136/83 (!) 158/88  Pulse: 93  75 76  Resp: 18  15 (!) 23  Temp: 97.7 F (36.5 C)  97.9 F (36.6 C)   TempSrc: Oral     SpO2: 96%  99% 90%  Weight:  73.5 kg    Height:  5\' 7"  (1.702 m)      Physical Exam Constitutional:      General: He is not in acute distress. HENT:     Head: Normocephalic  and atraumatic.  Eyes:     Extraocular Movements: Extraocular movements intact.     Conjunctiva/sclera: Conjunctivae normal.  Cardiovascular:     Rate and Rhythm: Normal rate and regular rhythm.     Pulses: Normal pulses.  Pulmonary:     Effort: Pulmonary effort is normal. No respiratory distress.     Breath sounds: Normal breath sounds. No wheezing or rales.  Abdominal:     General: Bowel sounds are normal. There is no distension.     Palpations: Abdomen is soft.     Tenderness: There is no abdominal tenderness.  Musculoskeletal:        General: No swelling or tenderness.     Cervical back: Normal range of motion and neck supple.   Skin:    General: Skin is warm and dry.  Neurological:     General: No focal deficit present.     Mental Status: He is alert and oriented to person, place, and time.     Labs on Admission: I have personally reviewed following labs and imaging studies  CBC: Recent Labs  Lab 04/18/21 1302  WBC 6.8  HGB 14.4  HCT 43.1  MCV 83.0  PLT 239   Basic Metabolic Panel: Recent Labs  Lab 04/18/21 1302  NA 135  K 3.2*  CL 98  CO2 27  GLUCOSE 346*  BUN 15  CREATININE 1.22  CALCIUM 9.5   GFR: Estimated Creatinine Clearance: 49.7 mL/min (by C-G formula based on SCr of 1.22 mg/dL). Liver Function Tests: No results for input(s): AST, ALT, ALKPHOS, BILITOT, PROT, ALBUMIN in the last 168 hours. No results for input(s): LIPASE, AMYLASE in the last 168 hours. No results for input(s): AMMONIA in the last 168 hours. Coagulation Profile: Recent Labs  Lab 04/18/21 2118  INR 1.0   Cardiac Enzymes: Recent Labs  Lab 04/18/21 1721  CKTOTAL 165   BNP (last 3 results) No results for input(s): PROBNP in the last 8760 hours. HbA1C: No results for input(s): HGBA1C in the last 72 hours. CBG: No results for input(s): GLUCAP in the last 168 hours. Lipid Profile: No results for input(s): CHOL, HDL, LDLCALC, TRIG, CHOLHDL, LDLDIRECT in the last 72 hours. Thyroid Function Tests: No results for input(s): TSH, T4TOTAL, FREET4, T3FREE, THYROIDAB in the last 72 hours. Anemia Panel: No results for input(s): VITAMINB12, FOLATE, FERRITIN, TIBC, IRON, RETICCTPCT in the last 72 hours. Urine analysis:    Component Value Date/Time   COLORURINE YELLOW 04/18/2021 1722   APPEARANCEUR CLEAR 04/18/2021 1722   LABSPEC >1.030 (H) 04/18/2021 1722   PHURINE 5.5 04/18/2021 1722   GLUCOSEU >=500 (A) 04/18/2021 1722   HGBUR NEGATIVE 04/18/2021 1722   BILIRUBINUR NEGATIVE 04/18/2021 1722   KETONESUR NEGATIVE 04/18/2021 1722   PROTEINUR NEGATIVE 04/18/2021 1722   UROBILINOGEN 1.0 03/21/2013 0815   NITRITE  NEGATIVE 04/18/2021 1722   LEUKOCYTESUR NEGATIVE 04/18/2021 1722    Radiological Exams on Admission: DG Chest 2 View  Result Date: 04/18/2021 CLINICAL DATA:  Cough dyspnea EXAM: CHEST - 2 VIEW COMPARISON:  10/12/2017 FINDINGS: Cardiac size upper limits of normal. Both lungs are clear. The visualized skeletal structures are unremarkable. IMPRESSION: No active cardiopulmonary disease. Electronically Signed   By: Jasmine Pang M.D.   On: 04/18/2021 18:12   CT Angio Chest PE W and/or Wo Contrast  Result Date: 04/18/2021 CLINICAL DATA:  Cough, lower back pain EXAM: CT ANGIOGRAPHY CHEST WITH CONTRAST TECHNIQUE: Multidetector CT imaging of the chest was performed using the standard protocol during bolus administration  of intravenous contrast. Multiplanar CT image reconstructions and MIPs were obtained to evaluate the vascular anatomy. CONTRAST:  47mL OMNIPAQUE IOHEXOL 350 MG/ML SOLN COMPARISON:  04/18/2021 FINDINGS: Cardiovascular: This is a technically adequate evaluation of the pulmonary vasculature. There are bilateral segmental pulmonary emboli, with moderate clot burden. Right ventricular dilatation, with RV/LV ratio measuring 1.2, consistent with right heart strain. No pericardial effusion. No evidence of thoracic aortic aneurysm or dissection. Mediastinum/Nodes: No enlarged mediastinal, hilar, or axillary lymph nodes. Thyroid gland, trachea, and esophagus demonstrate no significant findings. Lungs/Pleura: No acute airspace disease, effusion, or pneumothorax. Minimal dependent hypoventilatory changes within the left lower lobe. Central airways are patent. Upper Abdomen: No acute abnormality. Musculoskeletal: No acute or destructive bony lesions. Reconstructed images demonstrate no additional findings. Review of the MIP images confirms the above findings. IMPRESSION: 1. Bilateral segmental pulmonary emboli with CT evidence of right heart strain (RV/LV Ratio = 1.2) consistent with at least submassive  (intermediate risk) PE. The presence of right heart strain has been associated with an increased risk of morbidity and mortality. Please refer to the "PE Focused" order set in EPIC. 2. Critical Value/emergent results were called by telephone at the time of interpretation on 04/18/2021 at 9:11 pm to provider Pricilla Loveless , who verbally acknowledged these results. Electronically Signed   By: Sharlet Salina M.D.   On: 04/18/2021 21:11    EKG: Independently reviewed.  Sinus rhythm, RBBB.  New T wave abnormality in inferior and lateral leads.  Assessment/Plan Principal Problem:   Pulmonary embolism (HCC) Active Problems:   Elevated troponin   Hypokalemia   Diabetes (HCC)   Essential hypertension   Acute bilateral segmental PE D-dimer elevated and CT angiogram chest showing bilateral segmental PE with CT evidence of right heart strain (RV/LV ratio = 1.2) consistent with at least submassive (intermediate risk) PE.  Patient is hemodynamically stable, not hypoxic.  Troponin mildly elevated but stable.  Exact precipitating factor unclear at this time.  He did travel recently but symptoms started prior to travel. -Continue IV heparin.  Check BNP level.  Order echocardiogram and bilateral lower extremity Dopplers.  Continuous pulse ox, supplemental oxygen as needed to keep oxygen saturation above 92%.  Chest pain, mild troponin elevation, EKG changes Likely due to acute submassive PE.  EKG showing new T wave abnormality in inferior and lateral leads.  ACS less likely as high sensitive troponin mildly elevated but stable (27 > 27). -Echocardiogram ordered  Mild hypokalemia -Potassium supplement given.  Monitor potassium and magnesium levels, replace if low.  Non-insulin-dependent type 2 diabetes Blood glucose elevated in the 300s without signs of DKA. -Sliding scale insulin sensitive ACHS.  Hypertension Depression -Pharmacy med rec pending.  DVT prophylaxis: Heparin Code Status: Patient wishes  to be full code. Family Communication: No family at bedside.  Diagnostic findings and treatment plan discussed with the patient. Disposition Plan: Status is: Observation  The patient remains OBS appropriate and will d/c before 2 midnights.  Level of care: Level of care: Telemetry Medical  The medical decision making on this patient was of high complexity and the patient is at high risk for clinical deterioration, therefore this is a level 3 visit.  John Giovanni MD [facility]  If 7PM-7AM, please contact night-coverage [email or web address]  04/18/2021, 10:21 PM

## 2021-04-18 NOTE — ED Notes (Signed)
Pt back from x-ray.

## 2021-04-18 NOTE — ED Notes (Signed)
RN found pt out of bed, disconnected from monitor w/ blood on the floor. Pt had removed his IV, pt stated he needed to leave because we were taking too long. Rn explained we were waiting on CT scan and I had just called, and CT said he was next. Rn asked if we could place another IV, pt refused. Pt stated he was not waiting. Criss Alvine MD aware and at bedside.

## 2021-04-19 ENCOUNTER — Observation Stay (HOSPITAL_COMMUNITY): Payer: Medicare Other

## 2021-04-19 DIAGNOSIS — E119 Type 2 diabetes mellitus without complications: Secondary | ICD-10-CM | POA: Diagnosis not present

## 2021-04-19 DIAGNOSIS — Z882 Allergy status to sulfonamides status: Secondary | ICD-10-CM | POA: Diagnosis not present

## 2021-04-19 DIAGNOSIS — I2609 Other pulmonary embolism with acute cor pulmonale: Secondary | ICD-10-CM

## 2021-04-19 DIAGNOSIS — I10 Essential (primary) hypertension: Secondary | ICD-10-CM

## 2021-04-19 DIAGNOSIS — M79651 Pain in right thigh: Secondary | ICD-10-CM | POA: Diagnosis present

## 2021-04-19 DIAGNOSIS — Z888 Allergy status to other drugs, medicaments and biological substances status: Secondary | ICD-10-CM | POA: Diagnosis not present

## 2021-04-19 DIAGNOSIS — I2699 Other pulmonary embolism without acute cor pulmonale: Secondary | ICD-10-CM

## 2021-04-19 DIAGNOSIS — Z7984 Long term (current) use of oral hypoglycemic drugs: Secondary | ICD-10-CM | POA: Diagnosis not present

## 2021-04-19 DIAGNOSIS — Z881 Allergy status to other antibiotic agents status: Secondary | ICD-10-CM | POA: Diagnosis not present

## 2021-04-19 DIAGNOSIS — M79652 Pain in left thigh: Secondary | ICD-10-CM | POA: Diagnosis present

## 2021-04-19 DIAGNOSIS — Z20822 Contact with and (suspected) exposure to covid-19: Secondary | ICD-10-CM | POA: Diagnosis present

## 2021-04-19 DIAGNOSIS — R778 Other specified abnormalities of plasma proteins: Secondary | ICD-10-CM | POA: Diagnosis present

## 2021-04-19 DIAGNOSIS — I2694 Multiple subsegmental pulmonary emboli without acute cor pulmonale: Secondary | ICD-10-CM | POA: Diagnosis not present

## 2021-04-19 DIAGNOSIS — E876 Hypokalemia: Secondary | ICD-10-CM | POA: Diagnosis present

## 2021-04-19 DIAGNOSIS — F32A Depression, unspecified: Secondary | ICD-10-CM | POA: Diagnosis present

## 2021-04-19 DIAGNOSIS — Z794 Long term (current) use of insulin: Secondary | ICD-10-CM | POA: Diagnosis not present

## 2021-04-19 DIAGNOSIS — Z79899 Other long term (current) drug therapy: Secondary | ICD-10-CM | POA: Diagnosis not present

## 2021-04-19 DIAGNOSIS — Z87891 Personal history of nicotine dependence: Secondary | ICD-10-CM | POA: Diagnosis not present

## 2021-04-19 DIAGNOSIS — E1165 Type 2 diabetes mellitus with hyperglycemia: Secondary | ICD-10-CM | POA: Diagnosis present

## 2021-04-19 LAB — CBC
HCT: 38.7 % — ABNORMAL LOW (ref 39.0–52.0)
Hemoglobin: 12.6 g/dL — ABNORMAL LOW (ref 13.0–17.0)
MCH: 27.3 pg (ref 26.0–34.0)
MCHC: 32.6 g/dL (ref 30.0–36.0)
MCV: 83.9 fL (ref 80.0–100.0)
Platelets: 216 10*3/uL (ref 150–400)
RBC: 4.61 MIL/uL (ref 4.22–5.81)
RDW: 13.3 % (ref 11.5–15.5)
WBC: 6.3 10*3/uL (ref 4.0–10.5)
nRBC: 0 % (ref 0.0–0.2)

## 2021-04-19 LAB — ECHOCARDIOGRAM COMPLETE
Area-P 1/2: 3.42 cm2
Height: 67 in
S' Lateral: 3.1 cm
Weight: 2592 oz

## 2021-04-19 LAB — HEPARIN LEVEL (UNFRACTIONATED)
Heparin Unfractionated: 0.69 IU/mL (ref 0.30–0.70)
Heparin Unfractionated: 0.25 IU/mL — ABNORMAL LOW (ref 0.30–0.70)
Heparin Unfractionated: 0.72 IU/mL — ABNORMAL HIGH (ref 0.30–0.70)

## 2021-04-19 LAB — HEMOGLOBIN A1C
Hgb A1c MFr Bld: 11.9 % — ABNORMAL HIGH (ref 4.8–5.6)
Mean Plasma Glucose: 294.83 mg/dL

## 2021-04-19 LAB — GLUCOSE, CAPILLARY
Glucose-Capillary: 311 mg/dL — ABNORMAL HIGH (ref 70–99)
Glucose-Capillary: 120 mg/dL — ABNORMAL HIGH (ref 70–99)

## 2021-04-19 LAB — POTASSIUM: Potassium: 3.2 mmol/L — ABNORMAL LOW (ref 3.5–5.1)

## 2021-04-19 LAB — CBG MONITORING, ED
Glucose-Capillary: 174 mg/dL — ABNORMAL HIGH (ref 70–99)
Glucose-Capillary: 330 mg/dL — ABNORMAL HIGH (ref 70–99)

## 2021-04-19 LAB — BRAIN NATRIURETIC PEPTIDE: B Natriuretic Peptide: 13.6 pg/mL (ref 0.0–100.0)

## 2021-04-19 LAB — MAGNESIUM: Magnesium: 2 mg/dL (ref 1.7–2.4)

## 2021-04-19 MED ORDER — PERFLUTREN LIPID MICROSPHERE
1.0000 mL | INTRAVENOUS | Status: AC | PRN
Start: 2021-04-19 — End: 2021-04-19
  Administered 2021-04-19: 11:00:00 2 mL via INTRAVENOUS
  Filled 2021-04-19: qty 10

## 2021-04-19 MED ORDER — HEPARIN BOLUS VIA INFUSION
1100.0000 [IU] | Freq: Once | INTRAVENOUS | Status: AC
  Administered 2021-04-19: 13:00:00 1100 [IU] via INTRAVENOUS
  Filled 2021-04-19: qty 1100

## 2021-04-19 NOTE — Progress Notes (Signed)
PROGRESS NOTE    Paul Roberts  [id number] DOB: November 30, 1945 DOA: 04/18/2021 PCP: Deland Pretty, MD   Brief Narrative:  Paul Roberts is a 75 y.o. male with medical history significant of non-insulin-dependent type II diabetes, hypertension, depression, tremors/ eye movement abnormalities/cogwheeling concerning for Parkinson's disease versus normal parkinsonism followed by Dr. Brett Fairy from neurology.  He presents to the ED today complaining of shortness of breath, cough, and chest pain after recent long distance travel to [place] for a funeral. Symptoms began 1 week prior to admission while walking after a fall with profound leg pain and noted swollen tender leg thought to be due to he fall. In ED D-dimer found to be 3.75 - subsequent CT angiogram chest showing bilateral segmental PE with CT evidence of right heart strain (RV/LV ratio = 1.2) consistent with at least submassive (intermediate risk) PE.    Assessment & Plan:   Acute bilateral segmental PE - D-dimer elevated and CT angiogram chest showing bilateral segmental PE with CT evidence of right heart strain (RV/LV ratio = 1.2) consistent with at least submassive (intermediate risk) PE.   -Likely secondary to recent travel -Continue IV heparin -Echo -grade 1 diastolic dysfunction severely elevated pulmonary artery systolic pressure at 74 mmHg -Bilateral lower extremity study pending  Chest pain, secondary to above  -Secondary to acute submassive PE.   -Echocardiogram as above -ACS ruled out with low troponin, resolution of symptoms   Mild hypokalemia -Potassium supplement given.  Monitor potassium and magnesium levels, replace if low.   Non-insulin-dependent type 2 diabetes, profoundly uncontrolled -A1c 11.9 -Sliding scale insulin sensitive ACHS. -We will likely need to discharge on insulin   Hypertension Depression -Pharmacy med rec pending.   DVT prophylaxis: Heparin Code Status: Full. Family Communication: No family  at bedside.  Diagnostic findings and treatment plan discussed with the patient. Disposition Plan: Status is: Inpatient   Dispo: The patient is from: Home              Anticipated d/c is to: Home              Anticipated d/c date is: 24 to 48 hours              Patient currently not medically stable for discharge  Consultants:  None  Procedures:  None  Antimicrobials:  None  Subjective: No acute issues or events overnight denies nausea vomiting diarrhea constipation any fevers chills or chest pain  Objective: Vitals:   04/19/21 0345 04/19/21 0400 04/19/21 0642 04/19/21 0730  BP: (!) 147/76 (!) 154/82 (!) 154/75   Pulse: 77 77 73 70  Resp: 17 19 16 13   Temp:      TempSrc:      SpO2: 94% 94% 96% 94%  Weight:      Height:       No intake or output data in the 24 hours ending 04/19/21 0805 Filed Weights   04/18/21 1300  Weight: 73.5 kg    Examination:  General exam: Appears calm and comfortable  Respiratory system: Clear to auscultation. Respiratory effort normal. Cardiovascular system: S1 & S2 heard, RRR. No JVD, murmurs, rubs, gallops or clicks. No pedal edema. Gastrointestinal system: Abdomen is nondistended, soft and nontender. No organomegaly or masses felt. Normal bowel sounds heard. Central nervous system: Alert and oriented. No focal neurological deficits. Extremities: Symmetric 5 x 5 power. Skin: No rashes, lesions or ulcers Psychiatry: Judgement and insight appear normal. Mood & affect appropriate.   Data Reviewed: I have  personally reviewed following labs and imaging studies  CBC: Recent Labs  Lab 04/18/21 1302  WBC 6.8  HGB 14.4  HCT 43.1  MCV 83.0  PLT 239   Basic Metabolic Panel: Recent Labs  Lab 04/18/21 1302 04/19/21 0327  NA 135  --   K 3.2* 3.2*  CL 98  --   CO2 27  --   GLUCOSE 346*  --   BUN 15  --   CREATININE 1.22  --   CALCIUM 9.5  --   MG  --  2.0   GFR: Estimated Creatinine Clearance: 49.7 mL/min (by C-G formula based on  SCr of 1.22 mg/dL). Liver Function Tests: No results for input(s): AST, ALT, ALKPHOS, BILITOT, PROT, ALBUMIN in the last 168 hours. No results for input(s): LIPASE, AMYLASE in the last 168 hours. No results for input(s): AMMONIA in the last 168 hours. Coagulation Profile: Recent Labs  Lab 04/18/21 2118  INR 1.0   Cardiac Enzymes: Recent Labs  Lab 04/18/21 1721  CKTOTAL 165   BNP (last 3 results) No results for input(s): PROBNP in the last 8760 hours. HbA1C: Recent Labs    04/19/21 0327  HGBA1C 11.9*   CBG: Recent Labs  Lab 04/18/21 2224 04/19/21 0751  GLUCAP 220* 174*   Lipid Profile: No results for input(s): CHOL, HDL, LDLCALC, TRIG, CHOLHDL, LDLDIRECT in the last 72 hours. Thyroid Function Tests: No results for input(s): TSH, T4TOTAL, FREET4, T3FREE, THYROIDAB in the last 72 hours. Anemia Panel: No results for input(s): VITAMINB12, FOLATE, FERRITIN, TIBC, IRON, RETICCTPCT in the last 72 hours. Sepsis Labs: No results for input(s): PROCALCITON, LATICACIDVEN in the last 168 hours.  Recent Results (from the past 240 hour(s))  Resp Panel by RT-PCR (Flu A&B, Covid) Nasopharyngeal Swab     Status: None   Collection Time: 04/18/21  1:43 PM   Specimen: Nasopharyngeal Swab; Nasopharyngeal(NP) swabs in vial transport medium  Result Value Ref Range Status   SARS Coronavirus 2 by RT PCR NEGATIVE NEGATIVE Final    Comment: (NOTE) SARS-CoV-2 target nucleic acids are NOT DETECTED.  The SARS-CoV-2 RNA is generally detectable in upper respiratory specimens during the acute phase of infection. The lowest concentration of SARS-CoV-2 viral copies this assay can detect is 138 copies/mL. A negative result does not preclude SARS-Cov-2 infection and should not be used as the sole basis for treatment or other patient management decisions. A negative result may occur with  improper specimen collection/handling, submission of specimen other than nasopharyngeal swab, presence of viral  mutation(s) within the areas targeted by this assay, and inadequate number of viral copies(<138 copies/mL). A negative result must be combined with clinical observations, patient history, and epidemiological information. The expected result is Negative.  Fact Sheet for Patients:  BloggerCourse.com  Fact Sheet for Healthcare Providers:  SeriousBroker.it  This test is no t yet approved or cleared by the [place] FDA and  has been authorized for detection and/or diagnosis of SARS-CoV-2 by FDA under an Emergency Use Authorization (EUA). This EUA will remain  in effect (meaning this test can be used) for the duration of the COVID-19 declaration under Section 564(b)(1) of the Act, 21 U.S.C.section 360bbb-3(b)(1), unless the authorization is terminated  or revoked sooner.       Influenza A by PCR NEGATIVE NEGATIVE Final   Influenza B by PCR NEGATIVE NEGATIVE Final    Comment: (NOTE) The Xpert Xpress SARS-CoV-2/FLU/RSV plus assay is intended as an aid in the diagnosis of influenza from Nasopharyngeal swab specimens  and should not be used as a sole basis for treatment. Nasal washings and aspirates are unacceptable for Xpert Xpress SARS-CoV-2/FLU/RSV testing.  Fact Sheet for Patients: EntrepreneurPulse.com.au  Fact Sheet for Healthcare Providers: IncredibleEmployment.be  This test is not yet approved or cleared by the [place] FDA and has been authorized for detection and/or diagnosis of SARS-CoV-2 by FDA under an Emergency Use Authorization (EUA). This EUA will remain in effect (meaning this test can be used) for the duration of the COVID-19 declaration under Section 564(b)(1) of the Act, 21 U.S.C. section 360bbb-3(b)(1), unless the authorization is terminated or revoked.  Performed at [facility], [place] [address] [place], [place] [address]          Radiology Studies: DG  Chest 2 View  Result Date: 04/18/2021 CLINICAL DATA:  Cough dyspnea EXAM: CHEST - 2 VIEW COMPARISON:  10/12/2017 FINDINGS: Cardiac size upper limits of normal. Both lungs are clear. The visualized skeletal structures are unremarkable. IMPRESSION: No active cardiopulmonary disease. Electronically Signed   By: Donavan Foil M.D.   On: 04/18/2021 18:12   CT Angio Chest PE W and/or Wo Contrast  Result Date: 04/18/2021 CLINICAL DATA:  Cough, lower back pain EXAM: CT ANGIOGRAPHY CHEST WITH CONTRAST TECHNIQUE: Multidetector CT imaging of the chest was performed using the standard protocol during bolus administration of intravenous contrast. Multiplanar CT image reconstructions and MIPs were obtained to evaluate the vascular anatomy. CONTRAST:  80mL OMNIPAQUE IOHEXOL 350 MG/ML SOLN COMPARISON:  04/18/2021 FINDINGS: Cardiovascular: This is a technically adequate evaluation of the pulmonary vasculature. There are bilateral segmental pulmonary emboli, with moderate clot burden. Right ventricular dilatation, with RV/LV ratio measuring 1.2, consistent with right heart strain. No pericardial effusion. No evidence of thoracic aortic aneurysm or dissection. Mediastinum/Nodes: No enlarged mediastinal, hilar, or axillary lymph nodes. Thyroid gland, trachea, and esophagus demonstrate no significant findings. Lungs/Pleura: No acute airspace disease, effusion, or pneumothorax. Minimal dependent hypoventilatory changes within the left lower lobe. Central airways are patent. Upper Abdomen: No acute abnormality. Musculoskeletal: No acute or destructive bony lesions. Reconstructed images demonstrate no additional findings. Review of the MIP images confirms the above findings. IMPRESSION: 1. Bilateral segmental pulmonary emboli with CT evidence of right heart strain (RV/LV Ratio = 1.2) consistent with at least submassive (intermediate risk) PE. The presence of right heart strain has been associated with an increased risk of morbidity  and mortality. Please refer to the "PE Focused" order set in EPIC. 2. Critical Value/emergent results were called by telephone at the time of interpretation on 04/18/2021 at 9:11 pm to provider Sherwood Gambler , who verbally acknowledged these results. Electronically Signed   By: Randa Ngo M.D.   On: 04/18/2021 21:11        Scheduled Meds:  insulin aspart  0-5 Units Subcutaneous QHS   insulin aspart  0-9 Units Subcutaneous TID WC   Continuous Infusions:  heparin 1,300 Units/hr (04/19/21 0644)     LOS: 0 days   Time spent: 22min  Rutger Salton C Exie Chrismer, DO [facility]  If 7PM-7AM, please contact night-coverage [email or web address]  04/19/2021, 8:05 AM

## 2021-04-19 NOTE — ED Notes (Signed)
Pt disconnected his heparin drip, walking in hallway

## 2021-04-19 NOTE — Progress Notes (Signed)
ANTICOAGULATION CONSULT NOTE  Pharmacy Consult for Heparin Indication: pulmonary embolus  Allergies  Allergen Reactions   Ciprofloxacin Other (See Comments)   Rosuvastatin Nausea And Vomiting and Other (See Comments)   Sulfa Antibiotics Other (See Comments)    Pt denies    Patient Measurements: Height: 5\' 7"  (170.2 cm) Weight: 73.5 kg (162 lb) IBW/kg (Calculated) : 66.1 Heparin Dosing Weight: 73.5 kg  Vital Signs: Temp: 98.1 F (36.7 C) (12/14 2020) Temp Source: Oral (12/14 2020) BP: 148/80 (12/14 2020) Pulse Rate: 79 (12/14 2020)  Labs: Recent Labs    04/18/21 1302 04/18/21 1721 04/18/21 1943 04/18/21 2118 04/19/21 0327 04/19/21 1022 04/19/21 2238  HGB 14.4  --   --   --   --  12.6*  --   HCT 43.1  --   --   --   --  38.7*  --   PLT 239  --   --   --   --  216  --   LABPROT  --   --   --  13.1  --   --   --   INR  --   --   --  1.0  --   --   --   HEPARINUNFRC  --   --   --   --  0.69 0.25* 0.72*  CREATININE 1.22  --   --   --   --   --   --   CKTOTAL  --  165  --   --   --   --   --   TROPONINIHS  --  27* 27*  --   --   --   --      Estimated Creatinine Clearance: 49.7 mL/min (by C-G formula based on SCr of 1.22 mg/dL).   Medical History: Past Medical History:  Diagnosis Date   Diabetes mellitus without complication (HCC)    Hypertension     Medications:  Medications Prior to Admission  Medication Sig Dispense Refill Last Dose   amLODipine (NORVASC) 5 MG tablet Take 5 mg by mouth daily.   04/18/2021   aspirin 81 MG chewable tablet Chew 81 mg by mouth daily.   04/18/2021   atorvastatin (LIPITOR) 20 MG tablet Take 20 mg by mouth daily.   04/18/2021   Fluticasone Propionate (FLONASE NA) Place 1 spray into the nose as needed (allergies).   unk   glimepiride (AMARYL) 1 MG tablet Take 1 mg by mouth daily with breakfast.   04/18/2021   Glycerin-Hypromellose-PEG 400 (DRY EYE RELIEF DROPS OP) Apply 1-2 drops to eye as needed (dryness).   unk   metFORMIN  (GLUCOPHAGE-XR) 500 MG 24 hr tablet Take 500 mg by mouth 2 (two) times daily.  2 04/18/2021   Multiple Vitamin (MULTI-VITAMIN) tablet Take 1 tablet by mouth daily.   04/18/2021   NOVOLIN 70/30 KWIKPEN (70-30) 100 UNIT/ML KwikPen Inject 8 Units into the skin 2 (two) times daily.   04/18/2021   primidone (MYSOLINE) 50 MG tablet Take 100 mg by mouth at bedtime.   04/18/2021   propranolol (INDERAL) 10 MG tablet Take 10 mg by mouth 2 (two) times daily.   unk   tamsulosin (FLOMAX) 0.4 MG CAPS capsule Take 0.8 mg by mouth daily.   04/18/2021   valsartan-hydrochlorothiazide (DIOVAN-HCT) 160-25 MG per tablet Take 1 tablet by mouth at bedtime.    04/18/2021   glimepiride (AMARYL) 4 MG tablet Take 4 mg by mouth 2 (two) times daily. (Patient not taking:  Reported on 04/19/2021)   Not Taking   sertraline (ZOLOFT) 25 MG tablet Take 1 tablet (25 mg total) by mouth daily. (Patient not taking: Reported on 04/19/2021) 90 tablet 2 Not Taking     Assessment: 80 yom with a history of DM and HTN. Patient is presenting with SOB. Heparin per pharmacy consult placed for pulmonary embolus.  CT PE w/ Bilateral segmental pulmonary emboli w/ CT evidence of RHS. -heparin level= 0.72 on 1450 units/hr after rebolus and rate increase  Goal of Therapy:  Heparin level 0.3-0.7 units/ml Monitor platelets by anticoagulation protocol: Yes   Plan:  -No heparin changes now since close to goal and may be a bolus effect -Daily heparin level and CBC  Harland German, PharmD Clinical Pharmacist **Pharmacist phone directory can now be found on [email or web address] ([email or web address]).  Listed under [facility].

## 2021-04-19 NOTE — ED Notes (Signed)
Pt unhooked himself from all monitoring. Unhooked his IV and ambulated to the restroom.

## 2021-04-19 NOTE — Progress Notes (Signed)
ANTICOAGULATION CONSULT NOTE - Follow Up Consult  Pharmacy Consult for heparin Indication: pulmonary embolus  Labs: Recent Labs    04/18/21 1302 04/18/21 1721 04/18/21 1943 04/18/21 2118 04/19/21 0327  HGB 14.4  --   --   --   --   HCT 43.1  --   --   --   --   PLT 239  --   --   --   --   LABPROT  --   --   --  13.1  --   INR  --   --   --  1.0  --   HEPARINUNFRC  --   --   --   --  0.69  CREATININE 1.22  --   --   --   --   CKTOTAL  --  165  --   --   --   TROPONINIHS  --  27* 27*  --   --     Assessment/Plan:  75yo male therapeutic on heparin with initial dosing for PE. Will continue infusion at current rate of 1300 units/hr and confirm stable with additional level.   Vernard Gambles, PharmD, BCPS  04/19/2021,4:15 AM

## 2021-04-19 NOTE — Progress Notes (Signed)
BLE venous duplex has been completed.   Results can be found under chart review under CV PROC. 04/19/2021 11:47 AM Emilea Goga RVT, RDMS

## 2021-04-19 NOTE — ED Notes (Signed)
RN was in another room and say pt walking down hallway. RN attempted to get pt's attention, pt walking towards the previous room her was in. Pt disconnected his heparin drip. Multiple RN's attempted to stop pt, pt tried to go into previous room. RN explained that he had been moved to the hallway bed while he waits for inpatient bed. Pt was upset and angry, stating "who did this? You did this?" RN explained that another pt had to go in that room. Pt asking for supervisor, Charge RN Clydie Braun at bedside, pt stating "you're not the supervisor, who said you could move me from my room". Clydie Braun RN explained that it was her decision to move him from his room. This RN tried to explain that his bed was down the hallway, pt stated "shut your mouth, you don't speak to me". Clydie Braun RN stated that he could not speak to staff like that, security was called. Pt very upset, being very rude to staff, telling us to not speak to him and he can do what he wants. Security escorted pt to his bed.

## 2021-04-19 NOTE — Progress Notes (Signed)
Patient arrived to 6 north room 27 alert and oriented. Pain level 0. Call light in reach. Bed in lowest position.

## 2021-04-19 NOTE — Progress Notes (Signed)
Inpatient Diabetes Program Recommendations  AACE/ADA: New Consensus Statement on Inpatient Glycemic Control   Target Ranges:  Prepandial:   less than 140 mg/dL      Peak postprandial:   less than 180 mg/dL (1-2 hours)      Critically ill patients:  140 - 180 mg/dL    Latest Reference Range & Units 04/19/21 07:51 04/19/21 12:22  Glucose-Capillary 70 - 99 mg/dL 491 (H) 791 (H)    Review of Glycemic Control  Diabetes history: DM2 Outpatient Diabetes medications: 70/30 8 units BID, Amaryl 4 mg BID, Amaryl 1 mg QAM, Metformin XR 500 mg BID Current orders for Inpatient glycemic control: Novolog 0-9 units TID with meals, Novolog 0-5 units QHS  Inpatient Diabetes Program Recommendations:    Insulin: Please consider ordering Semglee 7 units Q24H.  Thanks, Orlando Penner, RN, MSN, CDE Diabetes Coordinator Inpatient Diabetes Program [phone] (Team Pager from 8am to 5pm)

## 2021-04-19 NOTE — Progress Notes (Signed)
ANTICOAGULATION CONSULT NOTE - Follow Up Consult  Pharmacy Consult for heparin Indication: pulmonary embolus  Labs: Recent Labs    04/18/21 1302 04/18/21 1721 04/18/21 1943 04/18/21 2118 04/19/21 0327 04/19/21 1022  HGB 14.4  --   --   --   --  12.6*  HCT 43.1  --   --   --   --  38.7*  PLT 239  --   --   --   --  216  LABPROT  --   --   --  13.1  --   --   INR  --   --   --  1.0  --   --   HEPARINUNFRC  --   --   --   --  0.69 0.25*  CREATININE 1.22  --   --   --   --   --   CKTOTAL  --  165  --   --   --   --   TROPONINIHS  --  27* 27*  --   --   --     Assessment/Plan: 75yo male on heparin gtt for new PE w/ RHS. 1st HL was therapeutic at 0.69 however confirmatory HL now 0.25.   Re-bolus heparin 1100 units (15 units/kg) then increase infusion from 1300 units/hr to 1450 units/hr.  F/u 8h HL and monitor daily levels + CBC  Loleta Dicker, PharmD, [facility] Emergency Medicine Clinical Pharmacist ED RPh Phone: [phone] Main RX: [phone]

## 2021-04-19 NOTE — ED Notes (Signed)
RN at bedside w/ security to reconnect pt's IV heparin infusion. RN explained that pt cannot disconnect the IV as it is the medicine to treat his blood clots, pt replied "you don't tell me what to do, shut your mouth and never speak to me again." Charge RN Clydie Braun aware

## 2021-04-20 ENCOUNTER — Other Ambulatory Visit (HOSPITAL_COMMUNITY): Payer: Self-pay

## 2021-04-20 DIAGNOSIS — Z794 Long term (current) use of insulin: Secondary | ICD-10-CM

## 2021-04-20 LAB — CBC
HCT: 36.9 % — ABNORMAL LOW (ref 39.0–52.0)
Hemoglobin: 12.5 g/dL — ABNORMAL LOW (ref 13.0–17.0)
MCH: 27.7 pg (ref 26.0–34.0)
MCHC: 33.9 g/dL (ref 30.0–36.0)
MCV: 81.6 fL (ref 80.0–100.0)
Platelets: 213 10*3/uL (ref 150–400)
RBC: 4.52 MIL/uL (ref 4.22–5.81)
RDW: 13.2 % (ref 11.5–15.5)
WBC: 6.2 10*3/uL (ref 4.0–10.5)
nRBC: 0 % (ref 0.0–0.2)

## 2021-04-20 LAB — HEPARIN LEVEL (UNFRACTIONATED): Heparin Unfractionated: 0.78 IU/mL — ABNORMAL HIGH (ref 0.30–0.70)

## 2021-04-20 LAB — BASIC METABOLIC PANEL
Anion gap: 10 (ref 5–15)
BUN: 11 mg/dL (ref 8–23)
CO2: 23 mmol/L (ref 22–32)
Calcium: 8.6 mg/dL — ABNORMAL LOW (ref 8.9–10.3)
Chloride: 106 mmol/L (ref 98–111)
Creatinine, Ser: 1.1 mg/dL (ref 0.61–1.24)
GFR, Estimated: >60 mL/min (ref >60)
Glucose, Bld: 111 mg/dL — ABNORMAL HIGH (ref 70–99)
Potassium: 3.2 mmol/L — ABNORMAL LOW (ref 3.5–5.1)
Sodium: 139 mmol/L (ref 135–145)

## 2021-04-20 LAB — GLUCOSE, CAPILLARY
Glucose-Capillary: 128 mg/dL — ABNORMAL HIGH (ref 70–99)
Glucose-Capillary: 371 mg/dL — ABNORMAL HIGH (ref 70–99)

## 2021-04-20 MED ORDER — APIXABAN 5 MG PO TABS
5.0000 mg | ORAL_TABLET | Freq: Two times a day (BID) | ORAL | 0 refills | Status: DC
  Filled 2021-04-20: qty 74, 30d supply, fill #0

## 2021-04-20 MED ORDER — APIXABAN 5 MG PO TABS
10.0000 mg | ORAL_TABLET | Freq: Two times a day (BID) | ORAL | 0 refills | Status: DC
  Filled 2021-04-20: qty 60, 15d supply, fill #0

## 2021-04-20 MED ORDER — APIXABAN 5 MG PO TABS: 5.0000 mg | ORAL_TABLET | Freq: Two times a day (BID) | ORAL | Status: DC

## 2021-04-20 MED ORDER — APIXABAN 5 MG PO TABS
10.0000 mg | ORAL_TABLET | Freq: Two times a day (BID) | ORAL | Status: DC
  Administered 2021-04-20: 10 mg via ORAL
  Filled 2021-04-20: qty 2

## 2021-04-20 NOTE — Care Management (Addendum)
Benefit check was completed by pharmacy. Pharmacy was changed to [facility]. Patient will receive first month supply of Eliquis or Xarelto free.     Transition of Care Department [facility]) has reviewed patient and no  other TOC needs have been identified at this time. We will continue to monitor patient advancement through interdisciplinary progression rounds. If new patient transition needs arise , please place a TOC consult

## 2021-04-20 NOTE — Progress Notes (Signed)
Inpatient Diabetes Program Recommendations  AACE/ADA: New Consensus Statement on Inpatient Glycemic Control (2015)  Target Ranges:  Prepandial:   less than 140 mg/dL      Peak postprandial:   less than 180 mg/dL (1-2 hours)      Critically ill patients:  140 - 180 mg/dL   Lab Results  Component Value Date   GLUCAP 371 (H) 04/20/2021   HGBA1C 11.9 (H) 04/19/2021    Review of Glycemic Control  Diabetes history: type 2 Outpatient Diabetes medications: Novolog 70/30 insulin 8 units BID, Metformin 500 mg BID, Amaryl 4 mg BID, 1 mg at breakfast Current orders for Inpatient glycemic control: Novolog 0-9 units TID, Novolog 0-5 units at [facility]  Inpatient Diabetes Program Recommendations:   Spoke with patient at the bedside. States that he was diagnosed with diabetes about 6 years ago. States that he has a PCP to follow his diabetes. States that he does check his blood sugars at home. Reviewed the Hgb1C of 11.9% and what level his blood sugars have been over the last 2-3 months. Gave him information on A1C and waht it means. States that he lives by himself and cooks his own food. He mainly answered my questions, but nothing more.   Smith Mince RN BSN CDE Diabetes Coordinator Pager: [phone]  8am-5pm

## 2021-04-20 NOTE — Progress Notes (Addendum)
ANTICOAGULATION CONSULT NOTE  Pharmacy Consult for Heparin Indication: pulmonary embolus  Allergies  Allergen Reactions   Ciprofloxacin Other (See Comments)   Rosuvastatin Nausea And Vomiting and Other (See Comments)   Sulfa Antibiotics Other (See Comments)    Pt denies    Patient Measurements: Height: 5\' 7"  (170.2 cm) Weight: 73.5 kg (162 lb) IBW/kg (Calculated) : 66.1 Heparin Dosing Weight: 73.5 kg  Vital Signs: Temp: 98.1 F (36.7 C) (12/15 0506) Temp Source: Oral (12/15 0506) BP: 168/87 (12/15 0506) Pulse Rate: 75 (12/15 0506)  Labs: Recent Labs    04/18/21 1302 04/18/21 1721 04/18/21 1943 04/18/21 2118 04/19/21 0327 04/19/21 1022 04/19/21 2238 04/20/21 0327  HGB 14.4  --   --   --   --  12.6*  --  12.5*  HCT 43.1  --   --   --   --  38.7*  --  36.9*  PLT 239  --   --   --   --  216  --  213  LABPROT  --   --   --  13.1  --   --   --   --   INR  --   --   --  1.0  --   --   --   --   HEPARINUNFRC  --   --   --   --    < > 0.25* 0.72* 0.78*  CREATININE 1.22  --   --   --   --   --   --  1.10  CKTOTAL  --  165  --   --   --   --   --   --   TROPONINIHS  --  27* 27*  --   --   --   --   --    < > = values in this interval not displayed.    Estimated Creatinine Clearance: 55.1 mL/min (by C-G formula based on SCr of 1.1 mg/dL).   Medical History: Past Medical History:  Diagnosis Date   Diabetes mellitus without complication (HCC)    Hypertension     Medications:  Medications Prior to Admission  Medication Sig Dispense Refill Last Dose   amLODipine (NORVASC) 5 MG tablet Take 5 mg by mouth daily.   04/18/2021   aspirin 81 MG chewable tablet Chew 81 mg by mouth daily.   04/18/2021   atorvastatin (LIPITOR) 20 MG tablet Take 20 mg by mouth daily.   04/18/2021   Fluticasone Propionate (FLONASE NA) Place 1 spray into the nose as needed (allergies).   unk   glimepiride (AMARYL) 1 MG tablet Take 1 mg by mouth daily with breakfast.   04/18/2021    Glycerin-Hypromellose-PEG 400 (DRY EYE RELIEF DROPS OP) Apply 1-2 drops to eye as needed (dryness).   unk   metFORMIN (GLUCOPHAGE-XR) 500 MG 24 hr tablet Take 500 mg by mouth 2 (two) times daily.  2 04/18/2021   Multiple Vitamin (MULTI-VITAMIN) tablet Take 1 tablet by mouth daily.   04/18/2021   NOVOLIN 70/30 KWIKPEN (70-30) 100 UNIT/ML KwikPen Inject 8 Units into the skin 2 (two) times daily.   04/18/2021   primidone (MYSOLINE) 50 MG tablet Take 100 mg by mouth at bedtime.   04/18/2021   propranolol (INDERAL) 10 MG tablet Take 10 mg by mouth 2 (two) times daily.   unk   tamsulosin (FLOMAX) 0.4 MG CAPS capsule Take 0.8 mg by mouth daily.   04/18/2021   valsartan-hydrochlorothiazide (DIOVAN-HCT) 160-25 MG  per tablet Take 1 tablet by mouth at bedtime.    04/18/2021   glimepiride (AMARYL) 4 MG tablet Take 4 mg by mouth 2 (two) times daily. (Patient not taking: Reported on 04/19/2021)   Not Taking   sertraline (ZOLOFT) 25 MG tablet Take 1 tablet (25 mg total) by mouth daily. (Patient not taking: Reported on 04/19/2021) 90 tablet 2 Not Taking     Assessment: 34 yom with a history of DM and HTN. Patient is presenting with SOB. Heparin per pharmacy consult placed for pulmonary embolus.  CT PE w/ Bilateral segmental pulmonary emboli w/ CT evidence of RHS. -heparin level accumulated some at 0.78 on 1450 units/hr  -CBC is stable -No bleeding reported  Goal of Therapy:  Heparin level 0.3-0.7 units/ml Monitor platelets by anticoagulation protocol: Yes   Plan:  -Decrease Heparin to 1400 units/hr -Recheck Heparin level in 8 hours -Daily heparin level and CBC - Follow-up plan for long-term oral anticoagulation  Addendum: Changed to Eliquis today for oral anticoagulation. Stop IV Heparin at the same time first dose of Eliquis is given.  Eliquis 10mg  (2 tablets) PO BID x7 days then on Thursday 12/22 reduce to 5mg  (1 tablet) po BID.  Pharmacy signed off Heparin consult and discontinued levels.    04-30-1968, PharmD, BCPS, BCCCP Clinical Pharmacist Please refer to [facility] for [facility] numbers 04/20/2021, 7:43 AM

## 2021-04-20 NOTE — TOC Benefit Eligibility Note (Signed)
Patient Product/process development scientist completed.    The patient is currently admitted and upon discharge could be taking Xarelto 20 mg.  The current 30 day co-pay is, $100.28.   The patient is currently admitted and upon discharge could be taking Eliquis 5 mg.  The current 30 day co-pay is, $102.68.   The patient is insured through [employer] Medicare Part D     Roland Earl, CPhT Pharmacy Patient Advocate Specialist [facility] Patient Advocate Team Direct Number: [phone]  Fax: [phone]

## 2021-04-20 NOTE — Discharge Summary (Signed)
Physician Discharge Summary  Paul Roberts [id number] DOB: 10-10-1945 DOA: 04/18/2021  PCP: Deland Pretty, MD  Admit date: 04/18/2021 Discharge date: 04/20/2021  Admitted From: Home  Disposition: Home  Recommendations for Outpatient Follow-up:  Follow up with PCP in 1-2 weeks Please obtain BMP/CBC in one week  Discharge Condition: Stable CODE STATUS: Full Diet recommendation: Low-salt low-fat diabetic diet  Brief/Interim Summary: Paul Roberts is a 75 y.o. male with medical history significant of Insulin-dependent type II diabetes, hypertension, depression, tremors/ eye movement abnormalities/cogwheeling concerning for Parkinson's disease versus normal parkinsonism followed by Dr. Brett Fairy from neurology.  He presents to the ED today complaining of shortness of breath, cough, and chest pain after recent long distance travel to [place] for a funeral. Symptoms began 1 week prior to admission while walking after a fall with profound leg pain and noted swollen tender leg thought to be due to he fall. In ED D-dimer found to be 3.75 - subsequent CT angiogram chest showing bilateral segmental PE with CT evidence of right heart strain (RV/LV ratio = 1.2) consistent with at least submassive (intermediate risk) PE.      Assessment & Plan:   Acute bilateral segmental PE, stabilizing - D-dimer elevated and CT angiogram chest showing bilateral segmental PE with CT evidence of right heart strain (RV/LV ratio = 1.2) consistent with at least submassive (intermediate risk) PE.   -Likely secondary to recent travel -Transition from IV heparin to Eliquis -Echo -grade 1 diastolic dysfunction severely elevated pulmonary artery systolic pressure at 74 mmHg -Bilateral lower extremity study negative -Respiratory symptoms and chest pain resolved   Chest pain, secondary to above, resolved -Secondary to acute submassive PE.   -Improving with anticoagulation supportive care   Mild hypokalemia -Continue to  increase p.o. intake appropriately, follow-up potassium with PCP   Insulin-dependent type 2 diabetes, profoundly uncontrolled -A1c 11.9 -Resume home medications as below   Hypertension Depression -Resume home medications.   Discharge Instructions   Allergies as of 04/20/2021       Reactions   Ciprofloxacin Other (See Comments)   Rosuvastatin Nausea And Vomiting, Other (See Comments)   Sulfa Antibiotics Other (See Comments)   Pt denies        Medication List     STOP taking these medications    aspirin 81 MG chewable tablet   sertraline 25 MG tablet Commonly known as: ZOLOFT       TAKE these medications    amLODipine 5 MG tablet Commonly known as: NORVASC Take 5 mg by mouth daily.   apixaban 5 MG Tabs tablet Commonly known as: ELIQUIS Take 2 tablets (10 mg total) by mouth 2 (two) times daily for 6 days.   apixaban 5 MG Tabs tablet Commonly known as: ELIQUIS Take 2 tablets (10mg ) by mouth twice daily for 6 days then take 1 tablet (5 mg total) by mouth 2 (two) times daily. Start taking on: April 27, 2021   atorvastatin 20 MG tablet Commonly known as: LIPITOR Take 20 mg by mouth daily.   DRY EYE RELIEF DROPS OP Apply 1-2 drops to eye as needed (dryness).   FLONASE NA Place 1 spray into the nose as needed (allergies).   glimepiride 1 MG tablet Commonly known as: AMARYL Take 1 mg by mouth daily with breakfast. What changed: Another medication with the same name was removed. Continue taking this medication, and follow the directions you see here.   metFORMIN 500 MG 24 hr tablet Commonly known as: GLUCOPHAGE-XR Take 500 mg  by mouth 2 (two) times daily.   Multi-Vitamin tablet Take 1 tablet by mouth daily.   NovoLIN 70/30 Kwikpen (70-30) 100 UNIT/ML KwikPen Generic drug: insulin isophane & regular human KwikPen Inject 8 Units into the skin 2 (two) times daily.   primidone 50 MG tablet Commonly known as: MYSOLINE Take 100 mg by mouth at  bedtime.   propranolol 10 MG tablet Commonly known as: INDERAL Take 10 mg by mouth 2 (two) times daily.   tamsulosin 0.4 MG Caps capsule Commonly known as: FLOMAX Take 0.8 mg by mouth daily.   valsartan-hydrochlorothiazide 160-25 MG tablet Commonly known as: DIOVAN-HCT Take 1 tablet by mouth at bedtime.        Allergies  Allergen Reactions   Ciprofloxacin Other (See Comments)   Rosuvastatin Nausea And Vomiting and Other (See Comments)   Sulfa Antibiotics Other (See Comments)    Pt denies    Consultations: None  Procedures/Studies: DG Chest 2 View  Result Date: 04/18/2021 CLINICAL DATA:  Cough dyspnea EXAM: CHEST - 2 VIEW COMPARISON:  10/12/2017 FINDINGS: Cardiac size upper limits of normal. Both lungs are clear. The visualized skeletal structures are unremarkable. IMPRESSION: No active cardiopulmonary disease. Electronically Signed   By: Donavan Foil M.D.   On: 04/18/2021 18:12   CT Angio Chest PE W and/or Wo Contrast  Result Date: 04/18/2021 CLINICAL DATA:  Cough, lower back pain EXAM: CT ANGIOGRAPHY CHEST WITH CONTRAST TECHNIQUE: Multidetector CT imaging of the chest was performed using the standard protocol during bolus administration of intravenous contrast. Multiplanar CT image reconstructions and MIPs were obtained to evaluate the vascular anatomy. CONTRAST:  12mL OMNIPAQUE IOHEXOL 350 MG/ML SOLN COMPARISON:  04/18/2021 FINDINGS: Cardiovascular: This is a technically adequate evaluation of the pulmonary vasculature. There are bilateral segmental pulmonary emboli, with moderate clot burden. Right ventricular dilatation, with RV/LV ratio measuring 1.2, consistent with right heart strain. No pericardial effusion. No evidence of thoracic aortic aneurysm or dissection. Mediastinum/Nodes: No enlarged mediastinal, hilar, or axillary lymph nodes. Thyroid gland, trachea, and esophagus demonstrate no significant findings. Lungs/Pleura: No acute airspace disease, effusion, or  pneumothorax. Minimal dependent hypoventilatory changes within the left lower lobe. Central airways are patent. Upper Abdomen: No acute abnormality. Musculoskeletal: No acute or destructive bony lesions. Reconstructed images demonstrate no additional findings. Review of the MIP images confirms the above findings. IMPRESSION: 1. Bilateral segmental pulmonary emboli with CT evidence of right heart strain (RV/LV Ratio = 1.2) consistent with at least submassive (intermediate risk) PE. The presence of right heart strain has been associated with an increased risk of morbidity and mortality. Please refer to the "PE Focused" order set in EPIC. 2. Critical Value/emergent results were called by telephone at the time of interpretation on 04/18/2021 at 9:11 pm to provider Sherwood Gambler , who verbally acknowledged these results. Electronically Signed   By: Randa Ngo M.D.   On: 04/18/2021 21:11   ECHOCARDIOGRAM COMPLETE  Result Date: 04/19/2021    ECHOCARDIOGRAM REPORT   Patient Name:   Paul Roberts Date of Exam: 04/19/2021 Medical Rec #:  [id number]       Height:       66.9 in Accession #:    [id number]      Weight:       162.0 lb Date of Birth:  12/02/1945       BSA:          1.847 m Patient Age:    41 years        BP:  149/88 mmHg Patient Gender: M               HR:           76 bpm. Exam Location:  Inpatient Procedure: 2D Echo, Color Doppler, Cardiac Doppler and Intracardiac            Opacification Agent Indications:    Pulmonary Emboli  History:        Patient has no prior history of Echocardiogram examinations.                 Risk Factors:Diabetes and Hypertension.  Sonographer:    Melissa Morford RDCS (AE, PE) Referring Phys: [id number] VASUNDHRA [place]  1. Left ventricular ejection fraction, by estimation, is 55 to 60%. The left ventricle has normal function. The left ventricle has no regional wall motion abnormalities. Left ventricular diastolic parameters are consistent with Grade I  diastolic dysfunction (impaired relaxation).  2. Right ventricular systolic function is normal. The right ventricular size is normal. There is severely elevated pulmonary artery systolic pressure. The estimated right ventricular systolic pressure is [id number] mmHg.  3. The mitral valve is normal in structure. No evidence of mitral valve regurgitation. No evidence of mitral stenosis.  4. Tricuspid valve regurgitation is moderate but eccentric in nature- best seen in the subcostal view.  5. The aortic valve is tricuspid. There is mild calcification of the aortic valve. Aortic valve regurgitation is not visualized. Aortic valve sclerosis is present, with no evidence of aortic valve stenosis. FINDINGS  Left Ventricle: Left ventricular ejection fraction, by estimation, is 55 to 60%. The left ventricle has normal function. The left ventricle has no regional wall motion abnormalities. The left ventricular internal cavity size was normal in size. There is  no left ventricular hypertrophy. Left ventricular diastolic parameters are consistent with Grade I diastolic dysfunction (impaired relaxation). Right Ventricle: The right ventricular size is normal. No increase in right ventricular wall thickness. Right ventricular systolic function is normal. There is severely elevated pulmonary artery systolic pressure. The tricuspid regurgitant velocity is 4.21 m/s, and with an assumed right atrial pressure of 3 mmHg, the estimated right ventricular systolic pressure is [id number] mmHg. Left Atrium: Left atrial size was normal in size. Right Atrium: Right atrial size was normal in size. Pericardium: There is no evidence of pericardial effusion. Mitral Valve: The mitral valve is normal in structure. Mild mitral annular calcification. No evidence of mitral valve regurgitation. No evidence of mitral valve stenosis. Tricuspid Valve: The tricuspid valve is normal in structure. Tricuspid valve regurgitation is moderate. Aortic Valve: The aortic valve  is tricuspid. There is mild calcification of the aortic valve. There is mild aortic valve annular calcification. Aortic valve regurgitation is not visualized. Aortic valve sclerosis is present, with no evidence of aortic valve stenosis. Pulmonic Valve: The pulmonic valve was normal in structure. Pulmonic valve regurgitation is trivial. No evidence of pulmonic stenosis. Aorta: The aortic root and ascending aorta are structurally normal, with no evidence of dilitation. IAS/Shunts: The atrial septum is grossly normal.  LEFT VENTRICLE PLAX 2D LVIDd:         4.20 cm   Diastology LVIDs:         3.10 cm   LV e' medial:    5.77 cm/s LV PW:         0.80 cm   LV E/e' medial:  11.7 LV IVS:        0.90 cm   LV e' lateral:   6.96 cm/s LVOT  diam:     2.00 cm   LV E/e' lateral: 9.7 LV SV:         63 LV SV Index:   34 LVOT Area:     3.14 cm  RIGHT VENTRICLE RV S prime:     9.57 cm/s TAPSE (M-mode): 2.0 cm LEFT ATRIUM             Index        RIGHT ATRIUM           Index LA diam:        3.10 cm 1.68 cm/m   RA Area:     16.30 cm LA Vol (A2C):   33.4 ml 18.08 ml/m  RA Volume:   46.90 ml  25.39 ml/m LA Vol (A4C):   26.9 ml 14.56 ml/m LA Biplane Vol: 32.6 ml 17.65 ml/m  AORTIC VALVE LVOT Vmax:   112.00 cm/s LVOT Vmean:  69.600 cm/s LVOT VTI:    0.200 m  AORTA Ao Root diam: 2.80 cm Ao Asc diam:  2.80 cm MITRAL VALVE               TRICUSPID VALVE MV Area (PHT): 3.42 cm    TR Peak grad:   70.9 mmHg MV Decel Time: 222 msec    TR Vmax:        421.00 cm/s MV E velocity: 67.30 cm/s MV A velocity: 88.10 cm/s  SHUNTS MV E/A ratio:  0.76        Systemic VTI:  0.20 m                            Systemic Diam: 2.00 cm Rudean Haskell MD Electronically signed by Rudean Haskell MD Signature Date/Time: 04/19/2021/4:19:26 PM    Final    VAS [place] LOWER EXTREMITY VENOUS (DVT)  Result Date: 04/20/2021  Lower Venous DVT Study Patient Name:  Paul Roberts  Date of Exam:   04/19/2021 Medical Rec #: [id number]        Accession #:     [id number] Date of Birth: [birth year]        Patient Gender: M Patient Age:   25 years Exam Location:  [facility] Procedure:      VAS [place] LOWER EXTREMITY VENOUS (DVT) Referring Phys: Wandra Feinstein RATHORE --------------------------------------------------------------------------------  Indications: Pulmonary embolism.  Comparison Study: No previous exams Performing Technologist: Jody Hill RVT, RDMS  Examination Guidelines: A complete evaluation includes B-mode imaging, spectral Doppler, color Doppler, and power Doppler as needed of all accessible portions of each vessel. Bilateral testing is considered an integral part of a complete examination. Limited examinations for reoccurring indications may be performed as noted. The reflux portion of the exam is performed with the patient in reverse Trendelenburg.  +---------+---------------+---------+-----------+----------+--------------+  RIGHT     Compressibility Phasicity Spontaneity Properties Thrombus Aging  +---------+---------------+---------+-----------+----------+--------------+  CFV       Full            Yes       Yes                                    +---------+---------------+---------+-----------+----------+--------------+  SFJ       Full                                                             +---------+---------------+---------+-----------+----------+--------------+  FV Prox   Full            Yes       Yes                                    +---------+---------------+---------+-----------+----------+--------------+  FV Mid    Full            Yes       Yes                                    +---------+---------------+---------+-----------+----------+--------------+  FV Distal Full            Yes       Yes                                    +---------+---------------+---------+-----------+----------+--------------+  PFV       Full                                                              +---------+---------------+---------+-----------+----------+--------------+  POP       Full            Yes       Yes                                    +---------+---------------+---------+-----------+----------+--------------+  PTV       Full                                                             +---------+---------------+---------+-----------+----------+--------------+  PERO      Full                                                             +---------+---------------+---------+-----------+----------+--------------+   +---------+---------------+---------+-----------+----------+--------------+  LEFT      Compressibility Phasicity Spontaneity Properties Thrombus Aging  +---------+---------------+---------+-----------+----------+--------------+  CFV       Full            Yes       Yes                                    +---------+---------------+---------+-----------+----------+--------------+  SFJ       Full                                                             +---------+---------------+---------+-----------+----------+--------------+  FV Prox   Full            Yes       Yes                                    +---------+---------------+---------+-----------+----------+--------------+  FV Mid    Full            Yes       Yes                                    +---------+---------------+---------+-----------+----------+--------------+  FV Distal Full            Yes       Yes                                    +---------+---------------+---------+-----------+----------+--------------+  PFV       Full                                                             +---------+---------------+---------+-----------+----------+--------------+  POP       Full            Yes       Yes                                    +---------+---------------+---------+-----------+----------+--------------+  PTV       Full                                                              +---------+---------------+---------+-----------+----------+--------------+  PERO      Full                                                             +---------+---------------+---------+-----------+----------+--------------+     Summary: BILATERAL: - No evidence of deep vein thrombosis seen in the lower extremities, bilaterally. - No evidence of superficial venous thrombosis in the lower extremities, bilaterally. -No evidence of popliteal cyst, bilaterally.   *See table(s) above for measurements and observations. Electronically signed by Orlie Pollen on 04/20/2021 at 7:52:06 AM.    Final      Subjective: No acute issues or events overnight   Discharge Exam: Vitals:   04/20/21 0759 04/20/21 1226  BP: (!) 157/81 (!) 142/71  Pulse: 75 83  Resp: 18 18  Temp: 98.3 F (36.8 C) 97.9 F (36.6 C)  SpO2: 94% 94%   Vitals:   04/20/21 0053 04/20/21 0506 04/20/21 0759 04/20/21 1226  BP: (!) 157/85 (!) 168/87 (!) 157/81 (!) 142/71  Pulse: 76 75 75 83  Resp: 17 17 18  18  Temp: 98.5 F (36.9 C) 98.1 F (36.7 C) 98.3 F (36.8 C) 97.9 F (36.6 C)  TempSrc: Oral Oral Oral Oral  SpO2: 96% 98% 94% 94%  Weight:      Height:        General: Pt is alert, awake, not in acute distress Cardiovascular: RRR, S1/S2 +, no rubs, no gallops Respiratory: CTA bilaterally, no wheezing, no rhonchi Abdominal: Soft, NT, ND, bowel sounds + Extremities: no edema, no cyanosis    The results of significant diagnostics from this hospitalization (including imaging, microbiology, ancillary and laboratory) are listed below for reference.     Microbiology: Recent Results (from the past 240 hour(s))  Resp Panel by RT-PCR (Flu A&B, Covid) Nasopharyngeal Swab     Status: None   Collection Time: 04/18/21  1:43 PM   Specimen: Nasopharyngeal Swab; Nasopharyngeal(NP) swabs in vial transport medium  Result Value Ref Range Status   SARS Coronavirus 2 by RT PCR NEGATIVE NEGATIVE Final    Comment: (NOTE) SARS-CoV-2  target nucleic acids are NOT DETECTED.  The SARS-CoV-2 RNA is generally detectable in upper respiratory specimens during the acute phase of infection. The lowest concentration of SARS-CoV-2 viral copies this assay can detect is 138 copies/mL. A negative result does not preclude SARS-Cov-2 infection and should not be used as the sole basis for treatment or other patient management decisions. A negative result may occur with  improper specimen collection/handling, submission of specimen other than nasopharyngeal swab, presence of viral mutation(s) within the areas targeted by this assay, and inadequate number of viral copies(<138 copies/mL). A negative result must be combined with clinical observations, patient history, and epidemiological information. The expected result is Negative.  Fact Sheet for Patients:  EntrepreneurPulse.com.au  Fact Sheet for Healthcare Providers:  IncredibleEmployment.be  This test is no t yet approved or cleared by the [place] FDA and  has been authorized for detection and/or diagnosis of SARS-CoV-2 by FDA under an Emergency Use Authorization (EUA). This EUA will remain  in effect (meaning this test can be used) for the duration of the COVID-19 declaration under Section 564(b)(1) of the Act, 21 U.S.C.section 360bbb-3(b)(1), unless the authorization is terminated  or revoked sooner.       Influenza A by PCR NEGATIVE NEGATIVE Final   Influenza B by PCR NEGATIVE NEGATIVE Final    Comment: (NOTE) The Xpert Xpress SARS-CoV-2/FLU/RSV plus assay is intended as an aid in the diagnosis of influenza from Nasopharyngeal swab specimens and should not be used as a sole basis for treatment. Nasal washings and aspirates are unacceptable for Xpert Xpress SARS-CoV-2/FLU/RSV testing.  Fact Sheet for Patients: EntrepreneurPulse.com.au  Fact Sheet for Healthcare  Providers: IncredibleEmployment.be  This test is not yet approved or cleared by the [place] FDA and has been authorized for detection and/or diagnosis of SARS-CoV-2 by FDA under an Emergency Use Authorization (EUA). This EUA will remain in effect (meaning this test can be used) for the duration of the COVID-19 declaration under Section 564(b)(1) of the Act, 21 U.S.C. section 360bbb-3(b)(1), unless the authorization is terminated or revoked.  Performed at [facility], [place] [address], [place], [place] [address]      Labs: BNP (last 3 results) Recent Labs    04/19/21 0327  BNP [id number]   Basic Metabolic Panel: Recent Labs  Lab 04/18/21 1302 04/19/21 0327 04/20/21 0327  NA 135  --  139  K 3.2* 3.2* 3.2*  CL 98  --  106  CO2 27  --  23  GLUCOSE 346*  --  111*  BUN 15  --  11  CREATININE 1.22  --  1.10  CALCIUM 9.5  --  8.6*  MG  --  2.0  --    Liver Function Tests: No results for input(s): AST, ALT, ALKPHOS, BILITOT, PROT, ALBUMIN in the last 168 hours. No results for input(s): LIPASE, AMYLASE in the last 168 hours. No results for input(s): AMMONIA in the last 168 hours. CBC: Recent Labs  Lab 04/18/21 1302 04/19/21 1022 04/20/21 0327  WBC 6.8 6.3 6.2  HGB 14.4 12.6* 12.5*  HCT 43.1 38.7* 36.9*  MCV 83.0 83.9 81.6  PLT 239 216 213   Cardiac Enzymes: Recent Labs  Lab 04/18/21 1721  CKTOTAL 165   BNP: Invalid input(s): POCBNP CBG: Recent Labs  Lab 04/19/21 1222 04/19/21 1639 04/19/21 2109 04/20/21 0801 04/20/21 1154  GLUCAP 330* 311* 120* 128* 371*   D-Dimer Recent Labs    04/18/21 1721  DDIMER 3.75*   Hgb A1c Recent Labs    04/19/21 0327  HGBA1C 11.9*   Lipid Profile No results for input(s): CHOL, HDL, LDLCALC, TRIG, CHOLHDL, LDLDIRECT in the last 72 hours. Thyroid function studies No results for input(s): TSH, T4TOTAL, T3FREE, THYROIDAB in the last 72 hours.  Invalid input(s): FREET3 Anemia work up No  results for input(s): VITAMINB12, FOLATE, FERRITIN, TIBC, IRON, RETICCTPCT in the last 72 hours. Urinalysis    Component Value Date/Time   COLORURINE YELLOW 04/18/2021 1722   APPEARANCEUR CLEAR 04/18/2021 1722   LABSPEC >1.030 (H) 04/18/2021 1722   PHURINE 5.5 04/18/2021 1722   GLUCOSEU >=500 (A) 04/18/2021 1722   HGBUR NEGATIVE 04/18/2021 1722   BILIRUBINUR NEGATIVE 04/18/2021 1722   KETONESUR NEGATIVE 04/18/2021 1722   PROTEINUR NEGATIVE 04/18/2021 1722   UROBILINOGEN 1.0 03/21/2013 0815   NITRITE NEGATIVE 04/18/2021 1722   LEUKOCYTESUR NEGATIVE 04/18/2021 1722   Sepsis Labs Invalid input(s): PROCALCITONIN,  WBC,  LACTICIDVEN Microbiology Recent Results (from the past 240 hour(s))  Resp Panel by RT-PCR (Flu A&B, Covid) Nasopharyngeal Swab     Status: None   Collection Time: 04/18/21  1:43 PM   Specimen: Nasopharyngeal Swab; Nasopharyngeal(NP) swabs in vial transport medium  Result Value Ref Range Status   SARS Coronavirus 2 by RT PCR NEGATIVE NEGATIVE Final    Comment: (NOTE) SARS-CoV-2 target nucleic acids are NOT DETECTED.  The SARS-CoV-2 RNA is generally detectable in upper respiratory specimens during the acute phase of infection. The lowest concentration of SARS-CoV-2 viral copies this assay can detect is 138 copies/mL. A negative result does not preclude SARS-Cov-2 infection and should not be used as the sole basis for treatment or other patient management decisions. A negative result may occur with  improper specimen collection/handling, submission of specimen other than nasopharyngeal swab, presence of viral mutation(s) within the areas targeted by this assay, and inadequate number of viral copies(<138 copies/mL). A negative result must be combined with clinical observations, patient history, and epidemiological information. The expected result is Negative.  Fact Sheet for Patients:  EntrepreneurPulse.com.au  Fact Sheet for Healthcare Providers:   IncredibleEmployment.be  This test is no t yet approved or cleared by the [place] FDA and  has been authorized for detection and/or diagnosis of SARS-CoV-2 by FDA under an Emergency Use Authorization (EUA). This EUA will remain  in effect (meaning this test can be used) for the duration of the COVID-19 declaration under Section 564(b)(1) of the Act, 21 U.S.C.section 360bbb-3(b)(1), unless the authorization is terminated  or  revoked sooner.       Influenza A by PCR NEGATIVE NEGATIVE Final   Influenza B by PCR NEGATIVE NEGATIVE Final    Comment: (NOTE) The Xpert Xpress SARS-CoV-2/FLU/RSV plus assay is intended as an aid in the diagnosis of influenza from Nasopharyngeal swab specimens and should not be used as a sole basis for treatment. Nasal washings and aspirates are unacceptable for Xpert Xpress SARS-CoV-2/FLU/RSV testing.  Fact Sheet for Patients: BloggerCourse.com  Fact Sheet for Healthcare Providers: SeriousBroker.it  This test is not yet approved or cleared by the [place] FDA and has been authorized for detection and/or diagnosis of SARS-CoV-2 by FDA under an Emergency Use Authorization (EUA). This EUA will remain in effect (meaning this test can be used) for the duration of the COVID-19 declaration under Section 564(b)(1) of the Act, 21 U.S.C. section 360bbb-3(b)(1), unless the authorization is terminated or revoked.  Performed at [facility], [address], [place], [place] [address]      Time coordinating discharge: Over 30 minutes  SIGNED:   Azucena Fallen, DO [facility] 04/20/2021, 12:31 PM Pager   If 7PM-7AM, please contact night-coverage [email or web address]

## 2021-04-20 NOTE — Discharge Instructions (Signed)
Information on my medicine - ELIQUIS (apixaban)  This medication education was reviewed with me or my healthcare representative as part of my discharge preparation.    Why was Eliquis prescribed for you? Eliquis was prescribed to treat blood clots that may have been found in the veins of your legs (deep vein thrombosis) or in your lungs (pulmonary embolism) and to reduce the risk of them occurring again.  What do You need to know about Eliquis ? The starting dose is 10 mg (two 5 mg tablets) taken TWICE daily for the FIRST SEVEN (7) DAYS, then on Thursday 04/27/21 the dose is reduced to ONE 5 mg tablet taken TWICE daily.  Eliquis may be taken with or without food.   Try to take the dose about the same time in the morning and in the evening. If you have difficulty swallowing the tablet whole please discuss with your pharmacist how to take the medication safely.  Take Eliquis exactly as prescribed and DO NOT stop taking Eliquis without talking to the doctor who prescribed the medication.  Stopping may increase your risk of developing a new blood clot.  Refill your prescription before you run out.  After discharge, you should have regular check-up appointments with your healthcare provider that is prescribing your Eliquis.    What do you do if you miss a dose? If a dose of ELIQUIS is not taken at the scheduled time, take it as soon as possible on the same day and twice-daily administration should be resumed. The dose should not be doubled to make up for a missed dose.  Important Safety Information A possible side effect of Eliquis is bleeding. You should call your healthcare provider right away if you experience any of the following: Bleeding from an injury or your nose that does not stop. Unusual colored urine (red or dark brown) or unusual colored stools (red or black). Unusual bruising for unknown reasons. A serious fall or if you hit your head (even if there is no bleeding).  Some  medicines may interact with Eliquis and might increase your risk of bleeding or clotting while on Eliquis. To help avoid this, consult your healthcare provider or pharmacist prior to using any new prescription or non-prescription medications, including herbals, vitamins, non-steroidal anti-inflammatory drugs (NSAIDs) and supplements.  This website has more information on Eliquis (apixaban): http://[email or web address].[email or web address]

## 2021-05-04 ENCOUNTER — Other Ambulatory Visit (HOSPITAL_COMMUNITY): Payer: Self-pay

## 2021-05-04 ENCOUNTER — Telehealth (HOSPITAL_COMMUNITY): Payer: Self-pay | Admitting: Pharmacy Technician

## 2021-05-04 NOTE — Telephone Encounter (Signed)
Pharmacy Transitions of Care Follow-up Telephone Call   Date of discharge: 04/20/2021  Discharge Diagnosis: PE   How have you been since you were released from the hospital? Patient says he's been feeling ok, no concerns at this time.   Medication changes made at discharge: START taking: Eliquis (apixaban)  CHANGE how you take: glimepiride (AMARYL)  STOP taking: aspirin 81 MG chewable tablet  sertraline 25 MG tablet (ZOLOFT) (patient reports he's still taking sertraline)   Medication changes verified by the patient? Yes     Medication Accessibility:   Home Pharmacy: [facility], [place], [place] [phone]    Was the patient provided with refills on discharged medications? No    Have all prescriptions been transferred from [facility] to home pharmacy? N/A    Is the patient able to afford medications? Yes, copay of Eliquis explained to patient and he said he can afford it. Notable copays: Eliquis $123.20/30 days    Medication Review:   APIXABAN (ELIQUIS)  Apixaban 10 mg BID initiated on 04/20/2021. Will switch to apixaban 5 mg BID after 6 days (DATE 04/27/2021)--per prescription. - Discussed importance of taking medication around the same time everyday  - Reviewed potential DDIs with patient  - Advised patient of medications to avoid (NSAIDs, ASA)  - Educated that Tylenol (acetaminophen) will be the preferred analgesic to prevent risk of bleeding  - Emphasized importance of monitoring for signs and symptoms of bleeding (abnormal bruising, prolonged bleeding, nose bleeds, bleeding from gums, discolored urine, black tarry stools)  - Advised patient to alert all providers of anticoagulation therapy prior to starting a new medication or having a procedure      Follow-up Appointments:   PCP Hospital f/u appt confirmed?  Patient has not scheduled f/u appointment with PCP Dr. Renne Crigler. Explained to patient the importance of scheduling this appointment for f/u care and refills to prevent  recurrent DVT/PE.   If their condition worsens, is the pt aware to call PCP or go to the Emergency Dept.? Yes   Final Patient Assessment:  -Pt is doing well.  -Pt verbalized understanding of Eliquis.  -Pt DOES NOT have post discharge appointment or refills. I called Dr. Carolee Rota office and spoke with Boyd Kerbs to let her know that patient needs a f/u. Boyd Kerbs explained that he did not want the available appointment, was rude to her and the nurse and refused to speak to anyone other than Dr. Renne Crigler. The request was forwarded to Dr. Renne Crigler. Patient does have an existing appointment scheduled for 07/2021 unrelated to discharge.  Mitzie Na, PharmD

## 2021-05-11 DIAGNOSIS — I2699 Other pulmonary embolism without acute cor pulmonale: Secondary | ICD-10-CM | POA: Diagnosis not present

## 2021-05-11 DIAGNOSIS — Z09 Encounter for follow-up examination after completed treatment for conditions other than malignant neoplasm: Secondary | ICD-10-CM | POA: Diagnosis not present

## 2021-05-11 DIAGNOSIS — E876 Hypokalemia: Secondary | ICD-10-CM | POA: Diagnosis not present

## 2021-05-22 DIAGNOSIS — E1165 Type 2 diabetes mellitus with hyperglycemia: Secondary | ICD-10-CM | POA: Diagnosis not present

## 2021-05-22 DIAGNOSIS — E876 Hypokalemia: Secondary | ICD-10-CM | POA: Diagnosis not present

## 2021-05-22 DIAGNOSIS — Z86711 Personal history of pulmonary embolism: Secondary | ICD-10-CM | POA: Diagnosis not present

## 2021-05-22 DIAGNOSIS — F4321 Adjustment disorder with depressed mood: Secondary | ICD-10-CM | POA: Diagnosis not present

## 2021-05-23 DIAGNOSIS — E118 Type 2 diabetes mellitus with unspecified complications: Secondary | ICD-10-CM | POA: Diagnosis not present

## 2021-06-07 DIAGNOSIS — E118 Type 2 diabetes mellitus with unspecified complications: Secondary | ICD-10-CM | POA: Diagnosis not present

## 2021-06-13 ENCOUNTER — Telehealth: Payer: Self-pay | Admitting: Hematology and Oncology

## 2021-06-13 NOTE — Telephone Encounter (Signed)
Scheduled appt per 2/7 referral. Pt is aware to arrive 15 mins prior to appt time.

## 2021-07-05 ENCOUNTER — Inpatient Hospital Stay: Payer: Medicare Other | Attending: Hematology and Oncology | Admitting: Hematology and Oncology

## 2021-07-05 ENCOUNTER — Inpatient Hospital Stay: Payer: Medicare Other

## 2021-07-05 ENCOUNTER — Telehealth: Payer: Self-pay | Admitting: *Deleted

## 2021-07-05 ENCOUNTER — Other Ambulatory Visit: Payer: Self-pay

## 2021-07-05 ENCOUNTER — Encounter: Payer: Self-pay | Admitting: Hematology and Oncology

## 2021-07-05 VITALS — BP 164/72 | HR 76 | Temp 97.8°F | Resp 17 | Wt 167.7 lb

## 2021-07-05 DIAGNOSIS — I2694 Multiple subsegmental pulmonary emboli without acute cor pulmonale: Secondary | ICD-10-CM | POA: Insufficient documentation

## 2021-07-05 DIAGNOSIS — E119 Type 2 diabetes mellitus without complications: Secondary | ICD-10-CM | POA: Diagnosis not present

## 2021-07-05 DIAGNOSIS — Z794 Long term (current) use of insulin: Secondary | ICD-10-CM | POA: Insufficient documentation

## 2021-07-05 DIAGNOSIS — I10 Essential (primary) hypertension: Secondary | ICD-10-CM | POA: Insufficient documentation

## 2021-07-05 DIAGNOSIS — Z7901 Long term (current) use of anticoagulants: Secondary | ICD-10-CM | POA: Insufficient documentation

## 2021-07-05 DIAGNOSIS — Z79899 Other long term (current) drug therapy: Secondary | ICD-10-CM | POA: Diagnosis not present

## 2021-07-05 LAB — CMP (CANCER CENTER ONLY)
ALT: 18 U/L (ref 0–44)
AST: 17 U/L (ref 15–41)
Albumin: 4.4 g/dL (ref 3.5–5.0)
Alkaline Phosphatase: 50 U/L (ref 38–126)
Anion gap: 8 (ref 5–15)
BUN: 17 mg/dL (ref 8–23)
CO2: 28 mmol/L (ref 22–32)
Calcium: 9.1 mg/dL (ref 8.9–10.3)
Chloride: 99 mmol/L (ref 98–111)
Creatinine: 1.16 mg/dL (ref 0.61–1.24)
GFR, Estimated: >60 mL/min (ref >60)
Glucose, Bld: 524 mg/dL (ref 70–99)
Potassium: 3.8 mmol/L (ref 3.5–5.1)
Sodium: 135 mmol/L (ref 135–145)
Total Bilirubin: 0.3 mg/dL (ref 0.3–1.2)
Total Protein: 7.8 g/dL (ref 6.5–8.1)

## 2021-07-05 LAB — CBC WITH DIFFERENTIAL (CANCER CENTER ONLY)
Abs Immature Granulocytes: 0.02 10*3/uL (ref 0.00–0.07)
Basophils Absolute: 0.1 10*3/uL (ref 0.0–0.1)
Basophils Relative: 1 %
Eosinophils Absolute: 0.2 10*3/uL (ref 0.0–0.5)
Eosinophils Relative: 3 %
HCT: 39.4 % (ref 39.0–52.0)
Hemoglobin: 13.2 g/dL (ref 13.0–17.0)
Immature Granulocytes: 0 %
Lymphocytes Relative: 37 %
Lymphs Abs: 2 10*3/uL (ref 0.7–4.0)
MCH: 27.6 pg (ref 26.0–34.0)
MCHC: 33.5 g/dL (ref 30.0–36.0)
MCV: 82.3 fL (ref 80.0–100.0)
Monocytes Absolute: 0.4 10*3/uL (ref 0.1–1.0)
Monocytes Relative: 7 %
Neutro Abs: 2.6 10*3/uL (ref 1.7–7.7)
Neutrophils Relative %: 52 %
Platelet Count: 208 10*3/uL (ref 150–400)
RBC: 4.79 MIL/uL (ref 4.22–5.81)
RDW: 13.6 % (ref 11.5–15.5)
WBC Count: 5.2 10*3/uL (ref 4.0–10.5)
nRBC: 0 % (ref 0.0–0.2)

## 2021-07-05 NOTE — Progress Notes (Signed)
[facility] [phone]   Fax:([phone]) [place] NOTE  Patient Care Team: Deland Pretty, MD as PCP - General (Internal Medicine)  Hematological/Oncological History # Unprovoked Bilateral Pulmonary Emboli 04/18/2021: CT Angio chest showed bilateral segmental pulmonary emboli with CT evidence of right heart strain (RV/LV Ratio = 1.2) consistent with at least submassive (intermediate risk) PE.  Initially started on heparin therapy but transitioned to Eliquis at time of discharge 07/05/2021: Establish care with Dr. Lorenso Courier  CHIEF COMPLAINTS/PURPOSE OF CONSULTATION:  "Unprovoked Bilateral Pulmonary Emboli "  HISTORY OF PRESENTING ILLNESS:  Paul Roberts 76 y.o. male with medical history significant for type 2 diabetes and hypertension who presents for evaluation of unprovoked bilateral pulmonary emboli.  On review of the previous records Paul Roberts presented to the emergency department on 04/18/2021 with chest pain and shortness of breath and was found to have bilateral segmental pulmonary emboli with CT evidence of right heart strain on CT angiogram of the chest.  He was started on Eliquis therapy.  Due to concern for his pulmonary emboli he was referred to hematology for further evaluation and management.  On exam today Paul Roberts reports that when he initially presented he was having aching in the chest and shortness of breath.  He notes that it occurred "not long" before he was taken to the hospital.  He reports that he was started on Eliquis therapy and has tolerated it well so far.  He has not had any issues with bleeding, bruising, or dark stools.  In terms of her provoking factor he is a non-smoker currently and has not recently undergone any surgery or travel.  He is unsure if there was any provoking factors.  On further discussion he notes that his mother passed away and was 54 and he was unsure how she died.  His father passed away in his 10s and  he is unsure how this occurred as well.  He notes that this happened in [place].  Patient reports he is not currently a smoker and quit over 9 years ago.  He used to drink but does not drink any further.  He is a Marketing executive by trade.  He currently denies any fevers, chills, sweats, nausea, vomiting or diarrhea.  Full 10 point ROS is listed below.  MEDICAL HISTORY:  Past Medical History:  Diagnosis Date   Diabetes mellitus without complication ([place])    Hypertension     SURGICAL HISTORY: Past Surgical History:  Procedure Laterality Date   NO PAST SURGERIES      SOCIAL HISTORY: Social History   Socioeconomic History   Marital status: Widowed    Spouse name: Not on file   Number of children: Not on file   Years of education: Not on file   Highest education level: Not on file  Occupational History   Not on file  Tobacco Use   Smoking status: Former   Smokeless tobacco: Never  Vaping Use   Vaping Use: Never used  Substance and Sexual Activity   Alcohol use: No   Drug use: No   Sexual activity: Not on file  Other Topics Concern   Not on file  Social History Narrative   Right handed   Social Determinants of Health   Financial Resource Strain: Not on file  Food Insecurity: Not on file  Transportation Needs: Not on file  Physical Activity: Not on file  Stress: Not on file  Social Connections: Not on file  Intimate Partner Violence:  Not on file    FAMILY HISTORY: History reviewed. No pertinent family history.  ALLERGIES:  is allergic to ciprofloxacin, rosuvastatin, and sulfa antibiotics.  MEDICATIONS:  Current Outpatient Medications  Medication Sig Dispense Refill   amLODipine (NORVASC) 5 MG tablet Take 5 mg by mouth daily.     apixaban (ELIQUIS) 5 MG TABS tablet Take 2 tablets (10mg ) by mouth twice daily for 6 days then take 1 tablet (5 mg total) by mouth 2 (two) times daily. 74 tablet 0   atorvastatin (LIPITOR) 20 MG tablet Take 20 mg by mouth daily.      glimepiride (AMARYL) 1 MG tablet Take 1 mg by mouth daily with breakfast.     Glycerin-Hypromellose-PEG 400 (DRY EYE RELIEF DROPS OP) Apply 1-2 drops to eye as needed (dryness).     metFORMIN (GLUCOPHAGE-XR) 500 MG 24 hr tablet Take 500 mg by mouth 2 (two) times daily.  2   Multiple Vitamin (MULTI-VITAMIN) tablet Take 1 tablet by mouth daily.     NOVOLIN 70/30 KWIKPEN (70-30) 100 UNIT/ML KwikPen Inject 8 Units into the skin 2 (two) times daily.     primidone (MYSOLINE) 50 MG tablet Take 100 mg by mouth at bedtime.     propranolol (INDERAL) 10 MG tablet Take 10 mg by mouth 2 (two) times daily.     tamsulosin (FLOMAX) 0.4 MG CAPS capsule Take 0.8 mg by mouth daily.     valsartan-hydrochlorothiazide (DIOVAN-HCT) 160-25 MG per tablet Take 1 tablet by mouth at bedtime.      No current facility-administered medications for this visit.    REVIEW OF SYSTEMS:   Constitutional: ( - ) fevers, ( - )  chills , ( - ) night sweats Eyes: ( - ) blurriness of vision, ( - ) double vision, ( - ) watery eyes Ears, nose, mouth, throat, and face: ( - ) mucositis, ( - ) sore throat Respiratory: ( - ) cough, ( - ) dyspnea, ( - ) wheezes Cardiovascular: ( - ) palpitation, ( - ) chest discomfort, ( - ) lower extremity swelling Gastrointestinal:  ( - ) nausea, ( - ) heartburn, ( - ) change in bowel habits Skin: ( - ) abnormal skin rashes Lymphatics: ( - ) new lymphadenopathy, ( - ) easy bruising Neurological: ( - ) numbness, ( - ) tingling, ( - ) new weaknesses Behavioral/Psych: ( - ) mood change, ( - ) new changes  All other systems were reviewed with the patient and are negative.  PHYSICAL EXAMINATION:  Vitals:   07/05/21 1307  BP: (!) 164/72  Pulse: 76  Resp: 17  Temp: 97.8 F (36.6 C)  SpO2: 100%   Filed Weights   07/05/21 1307  Weight: 167 lb 11.2 oz (76.1 kg)    GENERAL: well appearing elderly [place] male in NAD  SKIN: skin color, texture, turgor are normal, no rashes or significant  lesions EYES: conjunctiva are pink and non-injected, sclera clear LUNGS: clear to auscultation and percussion with normal breathing effort HEART: regular rate & rhythm and no murmurs and no lower extremity edema Musculoskeletal: no cyanosis of digits and no clubbing  PSYCH: alert & oriented x 3, fluent speech NEURO: no focal motor/sensory deficits  LABORATORY DATA:  I have reviewed the data as listed CBC Latest Ref Rng & Units 04/20/2021 04/19/2021 04/18/2021  WBC 4.0 - 10.5 K/uL 6.2 6.3 6.8  Hemoglobin 13.0 - 17.0 g/dL 12.5(L) 12.6(L) 14.4  Hematocrit 39.0 - 52.0 % 36.9(L) 38.7(L) 43.1  Platelets 150 -  400 K/uL 213 216 239    CMP Latest Ref Rng & Units 04/20/2021 04/19/2021 04/18/2021  Glucose 70 - 99 mg/dL 111(H) - 346(H)  BUN 8 - 23 mg/dL 11 - 15  Creatinine 0.61 - 1.24 mg/dL 1.10 - 1.22  Sodium 135 - 145 mmol/L 139 - 135  Potassium 3.5 - 5.1 mmol/L 3.2(L) 3.2(L) 3.2(L)  Chloride 98 - 111 mmol/L 106 - 98  CO2 22 - 32 mmol/L 23 - 27  Calcium 8.9 - 10.3 mg/dL 8.6(L) - 9.5  Total Protein 6.0 - 8.3 g/dL - - -  Total Bilirubin 0.3 - 1.2 mg/dL - - -  Alkaline Phos 39 - 117 U/L - - -  AST 0 - 37 U/L - - -  ALT 0 - 53 U/L - - -     ASSESSMENT & PLAN Paul Roberts 76 y.o. male with medical history significant for type 2 diabetes and hypertension who presents for evaluation of unprovoked bilateral pulmonary emboli.  After review of the labs, review of the records, and discussion with the patient the patients findings are most consistent with unprovoked bilateral pulmonary emboli.  A provoked venous thromboembolism (VTE) is one that has a clear inciting factor or event. Provoking factors include prolonged travel/immobility, surgery (particular abdominal or orthropedic), trauma,  and pregnancy/ estrogen containing birth control. After a detailed history and review of the records there is no clear provoking factor for this patients VTE.  Patients with unprovoked VTEs have up to 25%  recurrence after 5 years and 36% at 10 years, with 4% of these clots being fatal (BMJ?2019;366:l4363). Therefore the formal recommendation for unprovoked VTEs is lifelong anticoagulation, as the cause may not be transient or reversible. We recommend 6 months or full strength anticoagulation with a re-evaluation after that time.  The patients will then have a choice of maintenance dose DOAC (preferred, recommended), 81mg  ASA PO daily (non-preferred), or no further anticoagulation (not recommended).    #Bilateral Unprovoked Pulmonary Emboli --findings at this time are consistent with a unprovoked VTE  --will order baseline CMP and CBC to assure labs are adequate for DOAC therapy  --rule out APS with anticardiolipin and anti beta2 glycoprotein antibodies.  Lupus anticoagulant panel would be altered by presence of blood thinner, will hold on this testing.   --recommend the patient continue eliquis 5mg  BID   --patient denies any bleeding, bruising, or dark stools on this medication. It is well tolerated. No difficulties accessing/affording the medication  --RTC in 3 months time with strict return precautions for overt signs of bleeding.    Orders Placed This Encounter  Procedures   CBC with Differential ([facility] Only)    Standing Status:   Future    Number of Occurrences:   1    Standing Expiration Date:   01/05/2022   CMP ([facility] only)    Standing Status:   Future    Number of Occurrences:   1    Standing Expiration Date:   01/05/2022   Beta-2-glycoprotein i abs, IgG/M/A    Standing Status:   Future    Number of Occurrences:   1    Standing Expiration Date:   01/05/2022   Cardiolipin antibodies, IgG, IgM, IgA*    Standing Status:   Future    Number of Occurrences:   1    Standing Expiration Date:   01/05/2022   All questions were answered. The patient knows to call the clinic with any problems, questions or concerns.  A  total of more than 60 minutes were spent on this encounter  with face-to-face time and non-face-to-face time, including preparing to see the patient, ordering tests and/or medications, counseling the patient and coordination of care as outlined above.   Ledell Peoples, MD Department of Hematology/Oncology [place] [facility] Phone: [phone] Pager: [phone] Email: [email or web address]  07/05/2021 1:46 PM

## 2021-07-05 NOTE — Progress Notes (Unsigned)
Critical glucose of 524 called to [facility] at 1530 by Magda Bernheim ?

## 2021-07-05 NOTE — Telephone Encounter (Signed)
CRITICAL VALUE STICKER ? ?CRITICAL VALUE: Glucose 524 ? ?RECEIVER (on-site recipient of call): Drucie Ip, RN ? ?DATE & TIME NOTIFIED: 07/05/21, 3:30 pm ? ?MESSENGER (representative from lab): Pam ? ?MD NOTIFIED: Dr. Lorenso Courier ? ?TIME OF NOTIFICATION: 3:35 pm ? ?RESPONSE:  Patient notified  Pt's PCP , Dr. Deland Pretty notified. ? ? ?TCT patient regarding his elevated Glucose. No answer but was able to leave a vm message on his identified phone. Advised that his blood sugar was 524 and that he needed to go to ED to treat it. ? ?Advised to call his PCP as well. ? ?Lab results routed to Dr. Shelia Media. ?

## 2021-07-06 LAB — CARDIOLIPIN ANTIBODIES, IGG, IGM, IGA
Anticardiolipin IgA: <9 APL U/mL (ref 0–11)
Anticardiolipin IgG: <9 GPL U/mL (ref 0–14)
Anticardiolipin IgM: 46 MPL U/mL — ABNORMAL HIGH (ref 0–12)

## 2021-07-07 LAB — BETA-2-GLYCOPROTEIN I ABS, IGG/M/A
Beta-2 Glyco I IgG: <9 GPI IgG units (ref 0–20)
Beta-2-Glycoprotein I IgA: <9 GPI IgA units (ref 0–25)
Beta-2-Glycoprotein I IgM: <9 GPI IgM units (ref 0–32)

## 2021-10-04 ENCOUNTER — Inpatient Hospital Stay (HOSPITAL_BASED_OUTPATIENT_CLINIC_OR_DEPARTMENT_OTHER): Payer: Medicare Other | Admitting: Hematology and Oncology

## 2021-10-04 ENCOUNTER — Other Ambulatory Visit: Payer: Self-pay | Admitting: Hematology and Oncology

## 2021-10-04 ENCOUNTER — Inpatient Hospital Stay: Payer: Medicare Other | Attending: Hematology and Oncology

## 2021-10-04 ENCOUNTER — Other Ambulatory Visit: Payer: Self-pay

## 2021-10-04 VITALS — BP 145/66 | HR 79 | Temp 98.1°F | Resp 17 | Ht 67.0 in | Wt 172.9 lb

## 2021-10-04 DIAGNOSIS — I2694 Multiple subsegmental pulmonary emboli without acute cor pulmonale: Secondary | ICD-10-CM | POA: Diagnosis not present

## 2021-10-04 DIAGNOSIS — E1165 Type 2 diabetes mellitus with hyperglycemia: Secondary | ICD-10-CM | POA: Diagnosis not present

## 2021-10-04 DIAGNOSIS — Z87891 Personal history of nicotine dependence: Secondary | ICD-10-CM | POA: Diagnosis not present

## 2021-10-04 DIAGNOSIS — Z7984 Long term (current) use of oral hypoglycemic drugs: Secondary | ICD-10-CM | POA: Diagnosis not present

## 2021-10-04 DIAGNOSIS — R531 Weakness: Secondary | ICD-10-CM | POA: Diagnosis not present

## 2021-10-04 DIAGNOSIS — Z7901 Long term (current) use of anticoagulants: Secondary | ICD-10-CM | POA: Diagnosis not present

## 2021-10-04 DIAGNOSIS — I10 Essential (primary) hypertension: Secondary | ICD-10-CM | POA: Diagnosis not present

## 2021-10-04 DIAGNOSIS — Z79899 Other long term (current) drug therapy: Secondary | ICD-10-CM | POA: Insufficient documentation

## 2021-10-04 LAB — CMP (CANCER CENTER ONLY)
ALT: 14 U/L (ref 0–44)
AST: 13 U/L — ABNORMAL LOW (ref 15–41)
Albumin: 4.2 g/dL (ref 3.5–5.0)
Alkaline Phosphatase: 47 U/L (ref 38–126)
Anion gap: 10 (ref 5–15)
BUN: 16 mg/dL (ref 8–23)
CO2: 26 mmol/L (ref 22–32)
Calcium: 9.3 mg/dL (ref 8.9–10.3)
Chloride: 99 mmol/L (ref 98–111)
Creatinine: 1.19 mg/dL (ref 0.61–1.24)
GFR, Estimated: >60 mL/min (ref >60)
Glucose, Bld: 494 mg/dL — ABNORMAL HIGH (ref 70–99)
Potassium: 3.5 mmol/L (ref 3.5–5.1)
Sodium: 135 mmol/L (ref 135–145)
Total Bilirubin: 0.5 mg/dL (ref 0.3–1.2)
Total Protein: 7.3 g/dL (ref 6.5–8.1)

## 2021-10-04 LAB — CBC WITH DIFFERENTIAL (CANCER CENTER ONLY)
Abs Immature Granulocytes: 0.01 10*3/uL (ref 0.00–0.07)
Basophils Absolute: 0 10*3/uL (ref 0.0–0.1)
Basophils Relative: 1 %
Eosinophils Absolute: 0.2 10*3/uL (ref 0.0–0.5)
Eosinophils Relative: 4 %
HCT: 39.3 % (ref 39.0–52.0)
Hemoglobin: 13.2 g/dL (ref 13.0–17.0)
Immature Granulocytes: 0 %
Lymphocytes Relative: 32 %
Lymphs Abs: 1.7 10*3/uL (ref 0.7–4.0)
MCH: 27.4 pg (ref 26.0–34.0)
MCHC: 33.6 g/dL (ref 30.0–36.0)
MCV: 81.7 fL (ref 80.0–100.0)
Monocytes Absolute: 0.4 10*3/uL (ref 0.1–1.0)
Monocytes Relative: 8 %
Neutro Abs: 2.9 10*3/uL (ref 1.7–7.7)
Neutrophils Relative %: 55 %
Platelet Count: 190 10*3/uL (ref 150–400)
RBC: 4.81 MIL/uL (ref 4.22–5.81)
RDW: 13.3 % (ref 11.5–15.5)
WBC Count: 5.2 10*3/uL (ref 4.0–10.5)
nRBC: 0 % (ref 0.0–0.2)

## 2021-10-04 NOTE — Progress Notes (Signed)
[facility] Telephone:(336) [phone]   Fax:([phone]  PROGRESS NOTE  Patient Care Team: Merri BrunettePharr, Walter, MD as PCP - General (Internal Medicine)  Hematological/Oncological History # Unprovoked Bilateral Pulmonary Emboli 04/18/2021: CT Angio chest showed bilateral segmental pulmonary emboli with CT evidence of right heart strain (RV/LV Ratio = 1.2) consistent with at least submassive (intermediate risk) PE.  Initially started on heparin therapy but transitioned to Eliquis at time of discharge 07/05/2021: Establish care with Dr. Leonides Schanzorsey  Interval History:  Alquan M Karn CassisKebede 76 y.o. male with medical history significant for unprovoked bilateral pulmonary emboli who presents for a follow up visit. The patient's last visit was on 07/05/2021 at which time he establish care. In the interim since the last visit he has continued on eliquis therapy.   On exam Dr. Karn CassisKebede reports that he has been feeling somewhat weak in the interim since her last visit.  He reports that he has been having some difficulty with brain fog.  He is concerned this may be related to his Eliquis medication.  He notes he is not having any difficulties with bleeding or bruising.  He denies any dark stools.  He reports no fevers, chills, sweats, nausea, vomiting or diarrhea.  A full 10 point ROS is listed below.  MEDICAL HISTORY:  Past Medical History:  Diagnosis Date   Diabetes mellitus without complication (HCC)    Hypertension     SURGICAL HISTORY: Past Surgical History:  Procedure Laterality Date   NO PAST SURGERIES      SOCIAL HISTORY: Social History   Socioeconomic History   Marital status: Widowed    Spouse name: Not on file   Number of children: Not on file   Years of education: Not on file   Highest education level: Not on file  Occupational History   Not on file  Tobacco Use   Smoking status: Former   Smokeless tobacco: Never  Vaping Use   Vaping Use: Never used  Substance and Sexual  Activity   Alcohol use: No   Drug use: No   Sexual activity: Not on file  Other Topics Concern   Not on file  Social History Narrative   Right handed   Social Determinants of Health   Financial Resource Strain: Not on file  Food Insecurity: Not on file  Transportation Needs: Not on file  Physical Activity: Not on file  Stress: Not on file  Social Connections: Not on file  Intimate Partner Violence: Not on file    FAMILY HISTORY: No family history on file.  ALLERGIES:  is allergic to ciprofloxacin, rosuvastatin, and sulfa antibiotics.  MEDICATIONS:  Current Outpatient Medications  Medication Sig Dispense Refill   amLODipine (NORVASC) 5 MG tablet Take 5 mg by mouth daily.     apixaban (ELIQUIS) 5 MG TABS tablet Take 2 tablets (10mg ) by mouth twice daily for 6 days then take 1 tablet (5 mg total) by mouth 2 (two) times daily. 74 tablet 0   atorvastatin (LIPITOR) 20 MG tablet Take 20 mg by mouth daily.     glimepiride (AMARYL) 1 MG tablet Take 1 mg by mouth daily with breakfast.     Glycerin-Hypromellose-PEG 400 (DRY EYE RELIEF DROPS OP) Apply 1-2 drops to eye as needed (dryness).     metFORMIN (GLUCOPHAGE-XR) 500 MG 24 hr tablet Take 500 mg by mouth 2 (two) times daily.  2   Multiple Vitamin (MULTI-VITAMIN) tablet Take 1 tablet by mouth daily.     NOVOLIN 70/30 KWIKPEN (  70-30) 100 UNIT/ML KwikPen Inject 8 Units into the skin 2 (two) times daily.     primidone (MYSOLINE) 50 MG tablet Take 100 mg by mouth at bedtime.     propranolol (INDERAL) 10 MG tablet Take 10 mg by mouth 2 (two) times daily.     tamsulosin (FLOMAX) 0.4 MG CAPS capsule Take 0.8 mg by mouth daily.     valsartan-hydrochlorothiazide (DIOVAN-HCT) 160-25 MG per tablet Take 1 tablet by mouth at bedtime.      No current facility-administered medications for this visit.    REVIEW OF SYSTEMS:   Constitutional: ( - ) fevers, ( - )  chills , ( - ) night sweats Eyes: ( - ) blurriness of vision, ( - ) double vision, (  - ) watery eyes Ears, nose, mouth, throat, and face: ( - ) mucositis, ( - ) sore throat Respiratory: ( - ) cough, ( - ) dyspnea, ( - ) wheezes Cardiovascular: ( - ) palpitation, ( - ) chest discomfort, ( - ) lower extremity swelling Gastrointestinal:  ( - ) nausea, ( - ) heartburn, ( - ) change in bowel habits Skin: ( - ) abnormal skin rashes Lymphatics: ( - ) new lymphadenopathy, ( - ) easy bruising Neurological: ( - ) numbness, ( - ) tingling, ( - ) new weaknesses Behavioral/Psych: ( - ) mood change, ( - ) new changes  All other systems were reviewed with the patient and are negative.  PHYSICAL EXAMINATION:  Vitals:   10/04/21 1126  BP: (!) 145/66  Pulse: 79  Resp: 17  Temp: 98.1 F (36.7 C)  SpO2: 100%   Filed Weights   10/04/21 1126  Weight: 172 lb 14.4 oz (78.4 kg)    GENERAL: well appearing elderly male, alert, no distress and comfortable SKIN: skin color, texture, turgor are normal, no rashes or significant lesions EYES: conjunctiva are pink and non-injected, sclera clear LUNGS: clear to auscultation and percussion with normal breathing effort HEART: regular rate & rhythm and no murmurs and no lower extremity edema Musculoskeletal: no cyanosis of digits and no clubbing  PSYCH: alert & oriented x 3, fluent speech NEURO: no focal motor/sensory deficits  LABORATORY DATA:  I have reviewed the data as listed    Latest Ref Rng & Units 10/04/2021   11:09 AM 07/05/2021    1:44 PM 04/20/2021    3:27 AM  CBC  WBC 4.0 - 10.5 K/uL 5.2   5.2   6.2    Hemoglobin 13.0 - 17.0 g/dL 90.2   40.9   73.5    Hematocrit 39.0 - 52.0 % 39.3   39.4   36.9    Platelets 150 - 400 K/uL 190   208   213         Latest Ref Rng & Units 10/04/2021   11:09 AM 07/05/2021    1:44 PM 04/20/2021    3:27 AM  CMP  Glucose 70 - 99 mg/dL 329   924   268    BUN 8 - 23 mg/dL 16   17   11     Creatinine 0.61 - 1.24 mg/dL   3.41   9.62    Sodium 135 - 145 mmol/L 135   135   139    Potassium 3.5 -  5.1 mmol/L 3.5   3.8   3.2    Chloride 98 - 111 mmol/L 99   99   106    CO2 22 - 32 mmol/L 26  28   23    Calcium 8.9 - 10.3 mg/dL 9.3   9.1   8.6    Total Protein 6.5 - 8.1 g/dL 7.3   7.8     Total Bilirubin 0.3 - 1.2 mg/dL 0.5   0.3     Alkaline Phos 38 - 126 U/L 47   50     AST 15 - 41 U/L 13   17     ALT 0 - 44 U/L 14   18       RADIOGRAPHIC STUDIES: No results found.  ASSESSMENT & PLAN Ashaz HENRICK MCGUE 76 y.o. male with medical history significant for unprovoked bilateral pulmonary emboli who presents for a follow up visit.   A provoked venous thromboembolism (VTE) is one that has a clear inciting factor or event. Provoking factors include prolonged travel/immobility, surgery (particular abdominal or orthropedic), trauma,  and pregnancy/ estrogen containing birth control. After a detailed history and review of the records there is no clear provoking factor for this patient's VTE.  Patients with unprovoked VTEs have up to 25% recurrence after 5 years and 36% at 10 years, with 4% of these clots being fatal (BMJ?2019;366:l4363). Therefore the formal recommendation for unprovoked VTE's is lifelong anticoagulation, as the cause may not be transient or reversible. We recommend 6 months or full strength anticoagulation with a re-evaluation after that time.  The patient's will then have a choice of maintenance dose DOAC (preferred, recommended), 81mg  ASA PO daily (non-preferred), or no further anticoagulation (not recommended).    #Bilateral Unprovoked Pulmonary Emboli --findings at this time are consistent with a unprovoked VTE  --will order baseline CMP and CBC to assure labs are adequate for DOAC therapy  --ruled out APS with anticardiolipin and anti beta2 glycoprotein antibodies.  Lupus anticoagulant panel would be altered by presence of blood thinner, will hold on this testing.   --recommend the patient continue eliquis 5mg  BID   --patient denies any bleeding, bruising, or dark stools on  this medication. It is well tolerated. No difficulties accessing/affording the medication  --RTC in 3 months' time with strict return precautions for overt signs of bleeding.    # Hyperglycemia --patient notes he was recently started on insulin, did not take his dose today. --encourage him to take his insulin as prescribed and contact his PCP regarding this elevated sugar.   No orders of the defined types were placed in this encounter.   All questions were answered. The patient knows to call the clinic with any problems, questions or concerns.  A total of more than 30 minutes were spent on this encounter with face-to-face time and non-face-to-face time, including preparing to see the patient, ordering tests and/or medications, counseling the patient and coordination of care as outlined above.   , MD Department of Hematology/Oncology  [facility] Phone: [phone] Pager: [phone] Email: [phone]  10/08/2021 1:28 PM

## 2021-10-05 DIAGNOSIS — E1165 Type 2 diabetes mellitus with hyperglycemia: Secondary | ICD-10-CM | POA: Diagnosis not present

## 2021-10-05 DIAGNOSIS — I10 Essential (primary) hypertension: Secondary | ICD-10-CM | POA: Diagnosis not present

## 2021-10-18 DIAGNOSIS — I2699 Other pulmonary embolism without acute cor pulmonale: Secondary | ICD-10-CM | POA: Diagnosis not present

## 2021-10-19 DIAGNOSIS — R3912 Poor urinary stream: Secondary | ICD-10-CM | POA: Diagnosis not present

## 2021-10-19 DIAGNOSIS — R350 Frequency of micturition: Secondary | ICD-10-CM | POA: Diagnosis not present

## 2021-10-19 DIAGNOSIS — N3941 Urge incontinence: Secondary | ICD-10-CM | POA: Diagnosis not present

## 2021-10-26 DIAGNOSIS — E118 Type 2 diabetes mellitus with unspecified complications: Secondary | ICD-10-CM | POA: Diagnosis not present

## 2021-11-24 DIAGNOSIS — E1165 Type 2 diabetes mellitus with hyperglycemia: Secondary | ICD-10-CM | POA: Diagnosis not present

## 2021-11-24 DIAGNOSIS — R350 Frequency of micturition: Secondary | ICD-10-CM | POA: Diagnosis not present

## 2021-11-24 DIAGNOSIS — N3941 Urge incontinence: Secondary | ICD-10-CM | POA: Diagnosis not present

## 2021-11-24 DIAGNOSIS — I10 Essential (primary) hypertension: Secondary | ICD-10-CM | POA: Diagnosis not present

## 2021-11-29 ENCOUNTER — Other Ambulatory Visit: Payer: Self-pay | Admitting: Internal Medicine

## 2021-11-29 DIAGNOSIS — I10 Essential (primary) hypertension: Secondary | ICD-10-CM

## 2021-11-29 DIAGNOSIS — E039 Hypothyroidism, unspecified: Secondary | ICD-10-CM | POA: Diagnosis not present

## 2021-11-29 DIAGNOSIS — Z0000 Encounter for general adult medical examination without abnormal findings: Secondary | ICD-10-CM | POA: Diagnosis not present

## 2021-11-29 DIAGNOSIS — E118 Type 2 diabetes mellitus with unspecified complications: Secondary | ICD-10-CM | POA: Diagnosis not present

## 2022-01-02 ENCOUNTER — Other Ambulatory Visit: Payer: Self-pay | Admitting: *Deleted

## 2022-01-02 DIAGNOSIS — I2694 Multiple subsegmental pulmonary emboli without acute cor pulmonale: Secondary | ICD-10-CM

## 2022-01-03 ENCOUNTER — Inpatient Hospital Stay: Payer: Medicare Other | Attending: Hematology and Oncology | Admitting: Hematology and Oncology

## 2022-01-03 ENCOUNTER — Inpatient Hospital Stay: Payer: Medicare Other

## 2022-01-03 ENCOUNTER — Other Ambulatory Visit: Payer: Self-pay

## 2022-01-03 VITALS — BP 129/62 | HR 86 | Temp 97.8°F | Resp 17 | Ht 67.0 in | Wt 175.7 lb

## 2022-01-03 DIAGNOSIS — I2694 Multiple subsegmental pulmonary emboli without acute cor pulmonale: Secondary | ICD-10-CM

## 2022-01-03 DIAGNOSIS — Z86711 Personal history of pulmonary embolism: Secondary | ICD-10-CM | POA: Diagnosis not present

## 2022-01-03 DIAGNOSIS — Z7901 Long term (current) use of anticoagulants: Secondary | ICD-10-CM | POA: Insufficient documentation

## 2022-01-03 LAB — CBC WITH DIFFERENTIAL (CANCER CENTER ONLY)
Abs Immature Granulocytes: 0.01 10*3/uL (ref 0.00–0.07)
Basophils Absolute: 0 10*3/uL (ref 0.0–0.1)
Basophils Relative: 1 %
Eosinophils Absolute: 0.2 10*3/uL (ref 0.0–0.5)
Eosinophils Relative: 4 %
HCT: 36.5 % — ABNORMAL LOW (ref 39.0–52.0)
Hemoglobin: 12.5 g/dL — ABNORMAL LOW (ref 13.0–17.0)
Immature Granulocytes: 0 %
Lymphocytes Relative: 41 %
Lymphs Abs: 2.4 10*3/uL (ref 0.7–4.0)
MCH: 27.7 pg (ref 26.0–34.0)
MCHC: 34.2 g/dL (ref 30.0–36.0)
MCV: 80.9 fL (ref 80.0–100.0)
Monocytes Absolute: 0.5 10*3/uL (ref 0.1–1.0)
Monocytes Relative: 9 %
Neutro Abs: 2.6 10*3/uL (ref 1.7–7.7)
Neutrophils Relative %: 45 %
Platelet Count: 201 10*3/uL (ref 150–400)
RBC: 4.51 MIL/uL (ref 4.22–5.81)
RDW: 13.5 % (ref 11.5–15.5)
WBC Count: 5.8 10*3/uL (ref 4.0–10.5)
nRBC: 0 % (ref 0.0–0.2)

## 2022-01-03 LAB — CMP (CANCER CENTER ONLY)
ALT: 13 U/L (ref 0–44)
AST: 13 U/L — ABNORMAL LOW (ref 15–41)
Albumin: 4.3 g/dL (ref 3.5–5.0)
Alkaline Phosphatase: 46 U/L (ref 38–126)
Anion gap: 7 (ref 5–15)
BUN: 12 mg/dL (ref 8–23)
CO2: 29 mmol/L (ref 22–32)
Calcium: 9.6 mg/dL (ref 8.9–10.3)
Chloride: 102 mmol/L (ref 98–111)
Creatinine: 1.15 mg/dL (ref 0.61–1.24)
GFR, Estimated: >60 mL/min (ref >60)
Glucose, Bld: 275 mg/dL — ABNORMAL HIGH (ref 70–99)
Potassium: 3.3 mmol/L — ABNORMAL LOW (ref 3.5–5.1)
Sodium: 138 mmol/L (ref 135–145)
Total Bilirubin: 0.5 mg/dL (ref 0.3–1.2)
Total Protein: 7.1 g/dL (ref 6.5–8.1)

## 2022-01-03 MED ORDER — APIXABAN 2.5 MG PO TABS: 2.5000 mg | ORAL_TABLET | Freq: Two times a day (BID) | ORAL | 3 refills | Status: DC

## 2022-01-03 NOTE — Progress Notes (Signed)
[facility] [phone]   Fax:([phone]  PROGRESS NOTE  Patient Care Team: Merri Brunette, MD as PCP - General (Internal Medicine)  Hematological/Oncological History # Unprovoked Bilateral Pulmonary Emboli 04/18/2021: CT Angio chest showed bilateral segmental pulmonary emboli with CT evidence of right heart strain (RV/LV Ratio = 1.2) consistent with at least submassive (intermediate risk) PE.  Initially started on heparin therapy but transitioned to Eliquis at time of discharge 07/05/2021: Establish care with Dr. Leonides Schanz 01/03/2022: Transition to maintenance dose 2.5 mg twice daily Eliquis  Interval History:  Paul Roberts 76 y.o. male with medical history significant for unprovoked bilateral pulmonary emboli who presents for a follow up visit. The patient's last visit was on 10/04/2021. In the interim since the last visit he has continued on eliquis therapy.   On exam Dr. Karn Cassis reports he has been well overall in the interim since her last visit.  He notes that "a lot of things have been changing".  He notes that he does occasionally have headaches and that he was told her certifications he could not take on Eliquis.  He reports that he has not been trying Tylenol for his headaches.  He reports that he tolerates the Eliquis quite well without any bleeding, bruising, or dark stools.  He notes he does have some occasional cough but no chest pain.  He reports that the medication is well covered by his insurance and this is not constant.  He notes he would like to discontinue the blood thinner if it is possible but after discussion of risks and benefits he was willing and able to proceed with Eliquis therapy at this time.  He reports no fevers, chills, sweats, nausea, vomiting or diarrhea.  A full 10 point ROS is listed below.  MEDICAL HISTORY:  Past Medical History:  Diagnosis Date   Diabetes mellitus without complication (HCC)    Hypertension     SURGICAL  HISTORY: Past Surgical History:  Procedure Laterality Date   NO PAST SURGERIES      SOCIAL HISTORY: Social History   Socioeconomic History   Marital status: Widowed    Spouse name: Not on file   Number of children: Not on file   Years of education: Not on file   Highest education level: Not on file  Occupational History   Not on file  Tobacco Use   Smoking status: Former   Smokeless tobacco: Never  Vaping Use   Vaping Use: Never used  Substance and Sexual Activity   Alcohol use: No   Drug use: No   Sexual activity: Not on file  Other Topics Concern   Not on file  Social History Narrative   Right handed   Social Determinants of Health   Financial Resource Strain: Not on file  Food Insecurity: Not on file  Transportation Needs: Not on file  Physical Activity: Not on file  Stress: Not on file  Social Connections: Not on file  Intimate Partner Violence: Not on file    FAMILY HISTORY: No family history on file.  ALLERGIES:  is allergic to ciprofloxacin, rosuvastatin, and sulfa antibiotics.  MEDICATIONS:  Current Outpatient Medications  Medication Sig Dispense Refill   apixaban (ELIQUIS) 2.5 MG TABS tablet Take 1 tablet (2.5 mg total) by mouth 2 (two) times daily. 180 tablet 3   amLODipine (NORVASC) 5 MG tablet Take 5 mg by mouth daily.     atorvastatin (LIPITOR) 20 MG tablet Take 20 mg by mouth daily.  glimepiride (AMARYL) 1 MG tablet Take 1 mg by mouth daily with breakfast.     Glycerin-Hypromellose-PEG 400 (DRY EYE RELIEF DROPS OP) Apply 1-2 drops to eye as needed (dryness).     metFORMIN (GLUCOPHAGE-XR) 500 MG 24 hr tablet Take 500 mg by mouth 2 (two) times daily.  2   Multiple Vitamin (MULTI-VITAMIN) tablet Take 1 tablet by mouth daily.     NOVOLIN 70/30 KWIKPEN (70-30) 100 UNIT/ML KwikPen Inject 8 Units into the skin 2 (two) times daily.     primidone (MYSOLINE) 50 MG tablet Take 100 mg by mouth at bedtime.     propranolol (INDERAL) 10 MG tablet Take 10  mg by mouth 2 (two) times daily.     tamsulosin (FLOMAX) 0.4 MG CAPS capsule Take 0.8 mg by mouth daily.     valsartan-hydrochlorothiazide (DIOVAN-HCT) 160-25 MG per tablet Take 1 tablet by mouth at bedtime.      No current facility-administered medications for this visit.    REVIEW OF SYSTEMS:   Constitutional: ( - ) fevers, ( - )  chills , ( - ) night sweats Eyes: ( - ) blurriness of vision, ( - ) double vision, ( - ) watery eyes Ears, nose, mouth, throat, and face: ( - ) mucositis, ( - ) sore throat Respiratory: ( - ) cough, ( - ) dyspnea, ( - ) wheezes Cardiovascular: ( - ) palpitation, ( - ) chest discomfort, ( - ) lower extremity swelling Gastrointestinal:  ( - ) nausea, ( - ) heartburn, ( - ) change in bowel habits Skin: ( - ) abnormal skin rashes Lymphatics: ( - ) new lymphadenopathy, ( - ) easy bruising Neurological: ( - ) numbness, ( - ) tingling, ( - ) new weaknesses Behavioral/Psych: ( - ) mood change, ( - ) new changes  All other systems were reviewed with the patient and are negative.  PHYSICAL EXAMINATION:  Vitals:   01/03/22 1026  BP: 129/62  Pulse: 86  Resp: 17  Temp: 97.8 F (36.6 C)  SpO2: 97%   Filed Weights   01/03/22 1026  Weight: 175 lb 11.2 oz (79.7 kg)    GENERAL: well appearing elderly male, alert, no distress and comfortable SKIN: skin color, texture, turgor are normal, no rashes or significant lesions EYES: conjunctiva are pink and non-injected, sclera clear LUNGS: clear to auscultation and percussion with normal breathing effort HEART: regular rate & rhythm and no murmurs and no lower extremity edema Musculoskeletal: no cyanosis of digits and no clubbing  PSYCH: alert & oriented x 3, fluent speech NEURO: no focal motor/sensory deficits  LABORATORY DATA:  I have reviewed the data as listed    Latest Ref Rng & Units 01/03/2022   10:04 AM 10/04/2021   11:09 AM 07/05/2021    1:44 PM  CBC  WBC 4.0 - 10.5 K/uL 5.8  5.2  5.2   Hemoglobin 13.0 -  17.0 g/dL 89.2  11.9  41.7   Hematocrit 39.0 - 52.0 % 36.5  39.3  39.4   Platelets 150 - 400 K/uL 201  190  208        Latest Ref Rng & Units 01/03/2022   10:04 AM 10/04/2021   11:09 AM 07/05/2021    1:44 PM  CMP  Glucose 70 - 99 mg/dL 408  144  818   BUN 8 - 23 mg/dL 12  16  17    Creatinine 0.61 - 1.24 mg/dL  5.63  1.49   Sodium 135 -  145 mmol/L 138  135  135   Potassium 3.5 - 5.1 mmol/L 3.3  3.5  3.8   Chloride 98 - 111 mmol/L 102  99  99   CO2 22 - 32 mmol/L 29  26  28    Calcium 8.9 - 10.3 mg/dL 9.6  9.3  9.1   Total Protein 6.5 - 8.1 g/dL 7.1  7.3  7.8   Total Bilirubin 0.3 - 1.2 mg/dL 0.5  0.5  0.3   Alkaline Phos 38 - 126 U/L 46  47  50   AST 15 - 41 U/L 13  13  17    ALT 0 - 44 U/L 13  14  18      RADIOGRAPHIC STUDIES: No results found.  ASSESSMENT & PLAN Paul Roberts 76 y.o. male with medical history significant for unprovoked bilateral pulmonary emboli who presents for a follow up visit.   A provoked venous thromboembolism (VTE) is one that has a clear inciting factor or event. Provoking factors include prolonged travel/immobility, surgery (particular abdominal or orthropedic), trauma,  and pregnancy/ estrogen containing birth control. After a detailed history and review of the records there is no clear provoking factor for this patient's VTE.  Patients with unprovoked VTEs have up to 25% recurrence after 5 years and 36% at 10 years, with 4% of these clots being fatal (BMJ?2019;366:l4363). Therefore the formal recommendation for unprovoked VTE's is lifelong anticoagulation, as the cause may not be transient or reversible. We recommend 6 months or full strength anticoagulation with a re-evaluation after that time.  The patient's will then have a choice of maintenance dose DOAC (preferred, recommended), 81mg  ASA PO daily (non-preferred), or no further anticoagulation (not recommended).    #Bilateral Unprovoked Pulmonary Emboli --findings at this time are consistent with a  unprovoked VTE  --will order baseline CMP and CBC to assure labs are adequate for DOAC therapy  --ruled out APS with anticardiolipin and anti beta2 glycoprotein antibodies.  Lupus anticoagulant panel would be altered by presence of blood thinner, will hold on this testing.    --patient denies any bleeding, bruising, or dark stools on this medication. It is well tolerated. No difficulties accessing/affording the medication  Plan:  --Labs today show white blood cell count 5.8, hemoglobin 12.5, MCV 80.9, and platelets of 201 --recommend the patient continue eliquis 5mg  BID until his supply is complete and then transition to Eliquis 2.5 mg twice daily. --RTC in 6 months' time with strict return precautions for overt signs of bleeding.    # Hyperglycemia -- Improved from prior, glucose is 275 today. -- Continue to follow with primary care provider regarding sugar control.  No orders of the defined types were placed in this encounter.   All questions were answered. The patient knows to call the clinic with any problems, questions or concerns.  A total of more than 30 minutes were spent on this encounter with face-to-face time and non-face-to-face time, including preparing to see the patient, ordering tests and/or medications, counseling the patient and coordination of care as outlined above.   , MD Department of Hematology/Oncology [facility] Phone: [phone] Pager: [phone] Email: [email or web address]  01/03/2022 11:24 AM

## 2022-02-07 DIAGNOSIS — N3941 Urge incontinence: Secondary | ICD-10-CM | POA: Diagnosis not present

## 2022-02-20 DIAGNOSIS — N3941 Urge incontinence: Secondary | ICD-10-CM | POA: Diagnosis not present

## 2022-02-22 ENCOUNTER — Ambulatory Visit: Payer: Medicare Other

## 2022-02-26 DIAGNOSIS — E118 Type 2 diabetes mellitus with unspecified complications: Secondary | ICD-10-CM | POA: Diagnosis not present

## 2022-02-26 DIAGNOSIS — E1165 Type 2 diabetes mellitus with hyperglycemia: Secondary | ICD-10-CM | POA: Diagnosis not present

## 2022-02-26 DIAGNOSIS — I10 Essential (primary) hypertension: Secondary | ICD-10-CM | POA: Diagnosis not present

## 2022-02-26 DIAGNOSIS — R251 Tremor, unspecified: Secondary | ICD-10-CM | POA: Diagnosis not present

## 2022-03-09 DIAGNOSIS — Z23 Encounter for immunization: Secondary | ICD-10-CM | POA: Diagnosis not present

## 2022-04-06 ENCOUNTER — Encounter (INDEPENDENT_AMBULATORY_CARE_PROVIDER_SITE_OTHER): Payer: Medicare Other | Admitting: Ophthalmology

## 2022-04-06 DIAGNOSIS — H33303 Unspecified retinal break, bilateral: Secondary | ICD-10-CM | POA: Diagnosis not present

## 2022-04-06 DIAGNOSIS — H43813 Vitreous degeneration, bilateral: Secondary | ICD-10-CM

## 2022-04-06 DIAGNOSIS — I10 Essential (primary) hypertension: Secondary | ICD-10-CM | POA: Diagnosis not present

## 2022-04-06 DIAGNOSIS — H35033 Hypertensive retinopathy, bilateral: Secondary | ICD-10-CM | POA: Diagnosis not present

## 2022-04-08 IMAGING — CT CT ANGIO CHEST
2 of 7 series · 18 of 46 positions shown · IV contrast (APPLIED)
Comparison: 04/18/2021

CLINICAL DATA: Cough, lower back pain

EXAM:
CT ANGIOGRAPHY CHEST WITH CONTRAST
TECHNIQUE: Multidetector CT imaging of the chest was performed using the
standard protocol during bolus administration of intravenous
contrast. Multiplanar CT image reconstructions and MIPs were
obtained to evaluate the vascular anatomy.
CONTRAST:  70mL OMNIPAQUE IOHEXOL 350 MG/ML SOLN

[Series 7: thins · axial · 0.65mm/px · z∈[+1192,+1449]mm · 15 of 415 slices shown]
[im 24/415  lung]
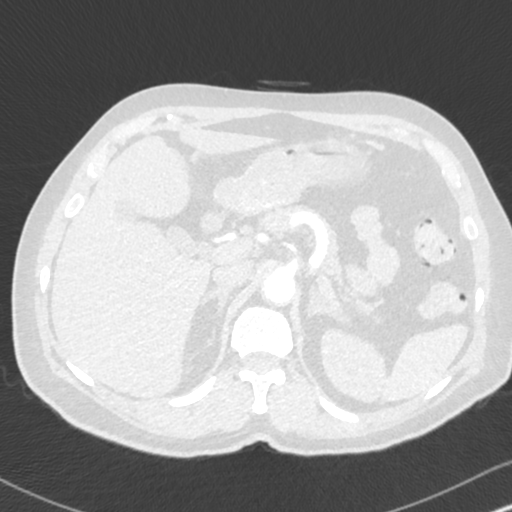
[im 47/415  soft-tissue]
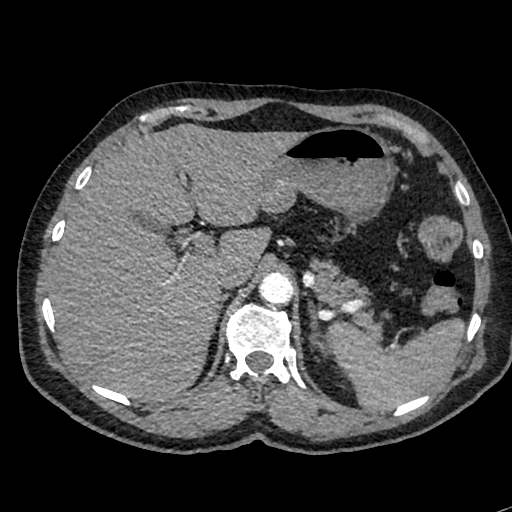
[im 70/415  lung]
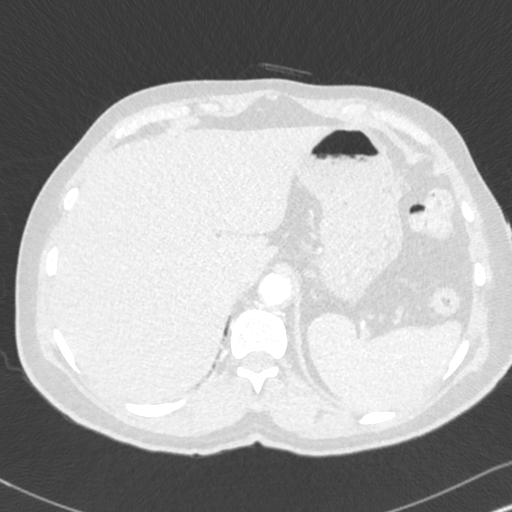
[im 93/415  soft-tissue]
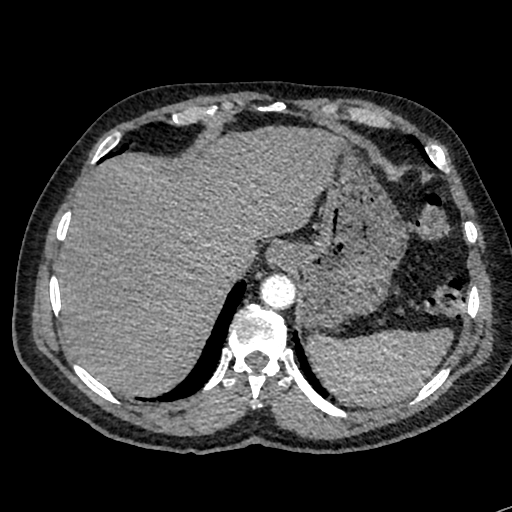
[im 139/415  lung]
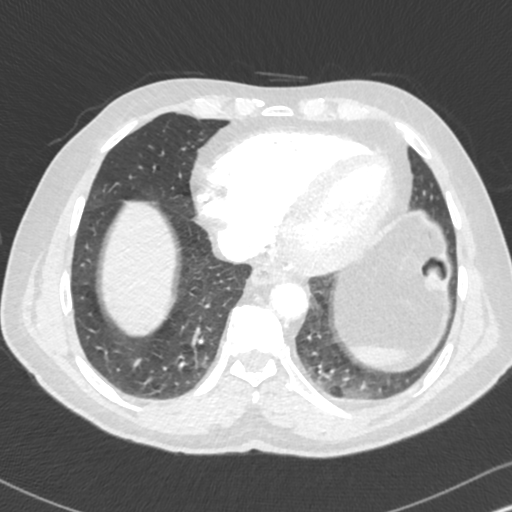
[im 162/415  soft-tissue]
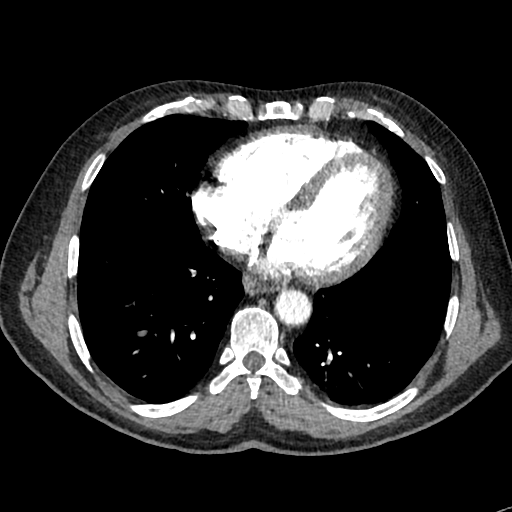
[im 185/415  lung]
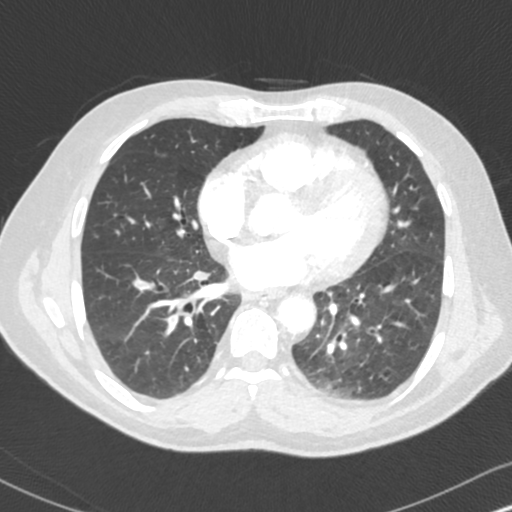
[im 208/415  soft-tissue]
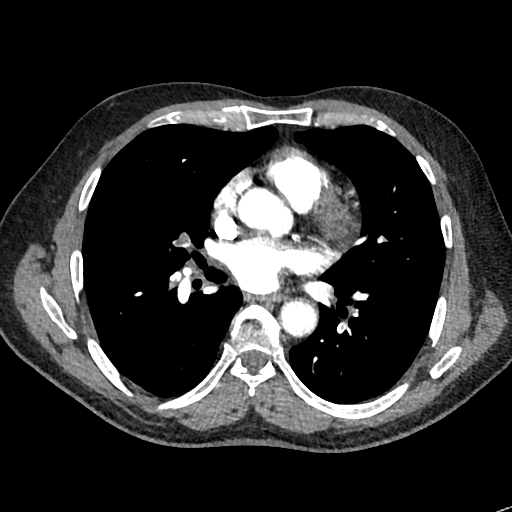
[im 231/415  lung]
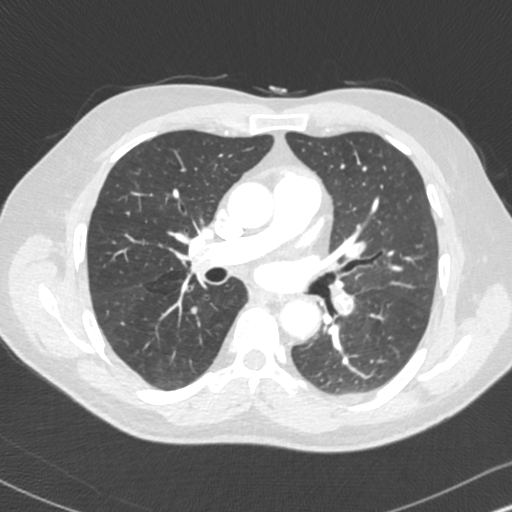
[im 254/415  soft-tissue]
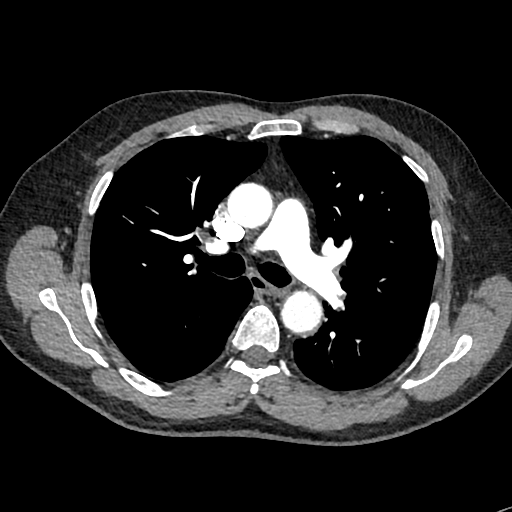
[im 277/415  lung]
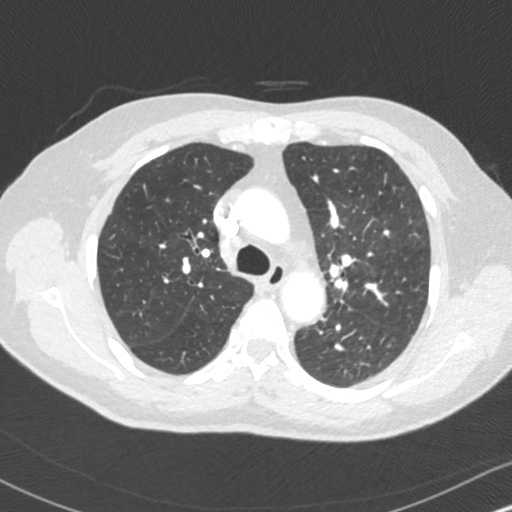
[im 323/415  soft-tissue]
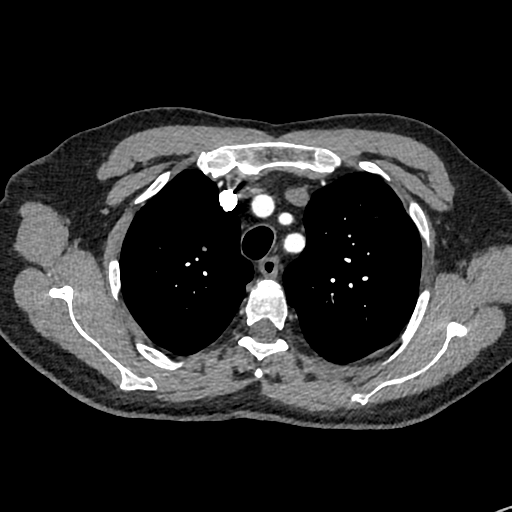
[im 346/415  lung]
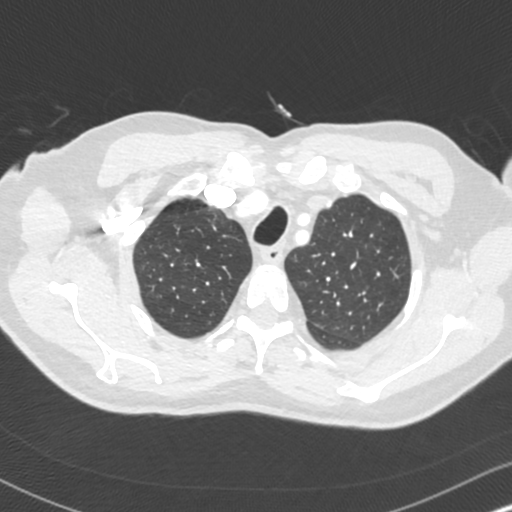
[im 369/415  soft-tissue]
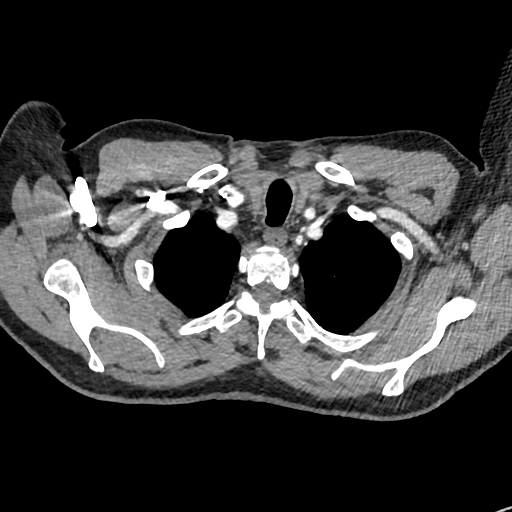
[im 392/415  lung]
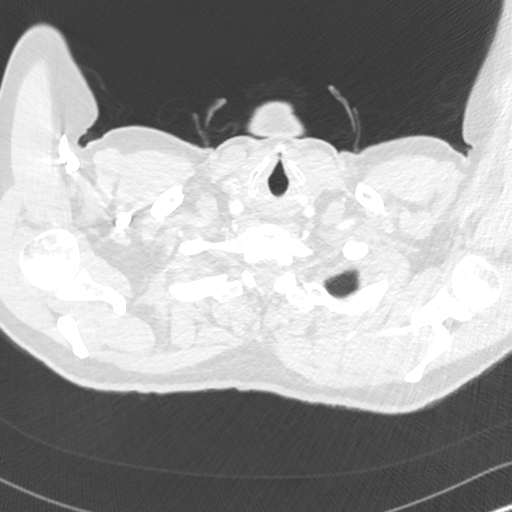

[Series 8: cor · coronal · 0.57mm/px · 3 of 164 slices shown]
[im 41/164  soft-tissue]
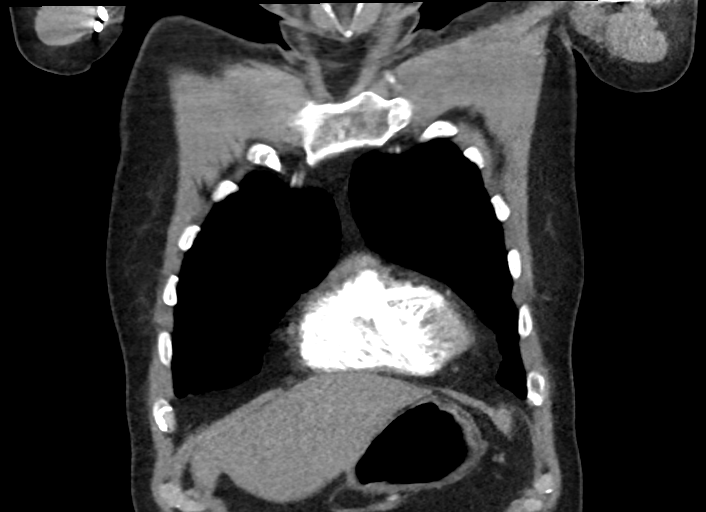
[im 82/164  soft-tissue]
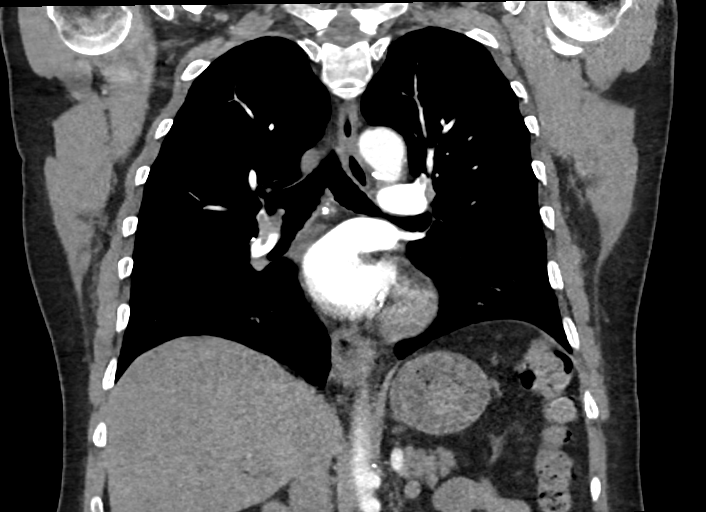
[im 123/164  soft-tissue]
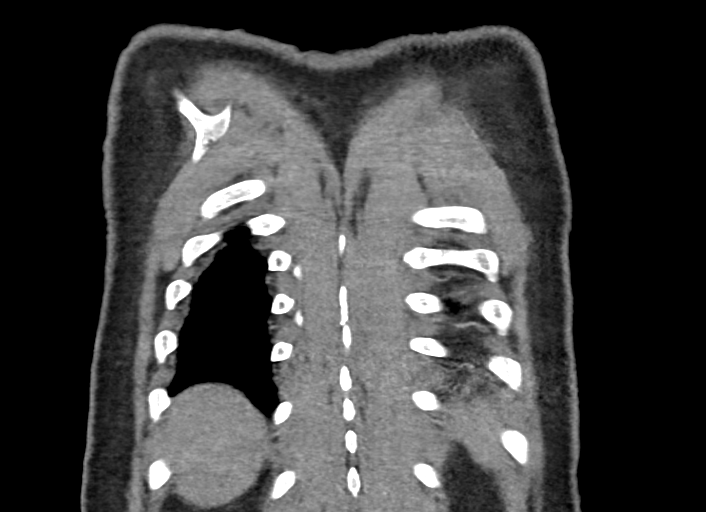

[18 of 46 positions shown; findings below may reference images not displayed]

FINDINGS: Cardiovascular: This is a technically adequate evaluation of the
pulmonary vasculature. There are bilateral segmental pulmonary
emboli, with moderate clot burden. Right ventricular dilatation,
with RV/LV ratio measuring 1.2, consistent with right heart strain.

No pericardial effusion. No evidence of thoracic aortic aneurysm or
dissection.

Mediastinum/Nodes: No enlarged mediastinal, hilar, or axillary lymph
nodes. Thyroid gland, trachea, and esophagus demonstrate no
significant findings.

Lungs/Pleura: No acute airspace disease, effusion, or pneumothorax.
Minimal dependent hypoventilatory changes within the left lower
lobe. Central airways are patent.

Upper Abdomen: No acute abnormality.

Musculoskeletal: No acute or destructive bony lesions. Reconstructed
images demonstrate no additional findings.

Review of the MIP images confirms the above findings.
IMPRESSION: 1. Bilateral segmental pulmonary emboli with CT evidence of right
heart strain (RV/LV Ratio = 1.2) consistent with at least submassive
(intermediate risk) PE. The presence of right heart strain has been
associated with an increased risk of morbidity and mortality. Please
refer to the "PE Focused" order set in [REDACTED].
2. Critical Value/emergent results were called by telephone at the
time of interpretation on 04/18/2021 at [DATE] to provider SANGRAN
IZA , who verbally acknowledged these results.

## 2022-04-19 DIAGNOSIS — M545 Low back pain, unspecified: Secondary | ICD-10-CM | POA: Diagnosis not present

## 2022-04-26 NOTE — Progress Notes (Signed)
   I, Paul Roberts, LAT, ATC acting as a scribe for [name], MD.  Subjective:    CC: back pain  HPI: Pt is a 76 y/o male c/o back pain.  Of note, patient suffered bilat segmental PE December 2022.  Today, patient presents with back pain ongoing almost 1 year.  Patient locates pain to the L-side of his low back.   Radiating pain: no LE numbness/tingling: no LE weakness: no Aggravates: prolonged walking, trying to sleep Treatments tried: tylenol  Dx imaging: 04/15/2018 L-spine XR  Pertinent review of Systems: No fevers or chills  Relevant historical information: Hypertension.  Diabetes.  Tremor left hand.   Objective:    Vitals:   05/01/22 1405  BP: (!) 162/68  Pulse: 75  SpO2: 97%   General: Well Developed, well nourished, and in no acute distress.   MSK: L-spine: Normal appearing Nontender midline. Tender palpation left lumbar paraspinal musculature. Decreased lumbar motion pain with rotation and lateral flexion.  Pain is improved with extension and worsened by flexion. Lower extremity strength reflexes and sensation are intact distally.  Lab and Radiology Results  X-ray images lumbar spine obtained at primary care provider office on December 14 and provided by patient on a CD today personally and independently interpreted. Multilevel degenerative disc disease and mild facet DJD.  No acute fractures are visible.   Impression and Recommendations:    Assessment and Plan: 76 y.o. male with chronic left low back pain.  Pain is mostly more lateral left low back in the paraspinal muscle area or perhaps in the quadratus lumborum area.  Pain is worse with activity.  But the location of pain and the pain worsens with activity indicates this is more muscle related than arthritis related.  I am sure his arthritis is contributing to his pain some as well..  Plan for trial of physical therapy.  [place] so we [place] use that location.   Additionally his primary care physician prescribed tramadol.  Has not tried taking it yet.  I did encourage him to try it at bedtime.  I also encouraged trying Tylenol.  Recheck in 6 weeks.  PDMP not reviewed this encounter. Orders Placed This Encounter  Procedures   Ambulatory referral to Physical Therapy    Referral Priority:   Routine    Referral Type:   Physical Medicine    Referral Reason:   Specialty Services Required    Requested Specialty:   Physical Therapy    Number of Visits Requested:   1   No orders of the defined types were placed in this encounter.   Discussed warning signs or symptoms. Please see discharge instructions. Patient expresses understanding.   The above documentation has been reviewed and is accurate and complete [name], M.D.

## 2022-05-01 ENCOUNTER — Ambulatory Visit (INDEPENDENT_AMBULATORY_CARE_PROVIDER_SITE_OTHER): Payer: Medicare Other | Admitting: Family Medicine

## 2022-05-01 VITALS — BP 162/68 | HR 75 | Ht 67.0 in | Wt 175.0 lb

## 2022-05-01 DIAGNOSIS — G8929 Other chronic pain: Secondary | ICD-10-CM

## 2022-05-01 DIAGNOSIS — M545 Low back pain, unspecified: Secondary | ICD-10-CM

## 2022-05-01 NOTE — Patient Instructions (Signed)
Thank you for coming in today.   I've referred you to Physical Therapy.  Let us know if you don't hear from them in one week.   Use heat on the back.   OK to take your tramadol in the evening.   Recheck with me in 6 weeks.

## 2022-05-02 ENCOUNTER — Ambulatory Visit: Payer: Medicare Other | Attending: Family Medicine | Admitting: Physical Therapy

## 2022-05-02 ENCOUNTER — Encounter: Payer: Self-pay | Admitting: Physical Therapy

## 2022-05-02 DIAGNOSIS — G8929 Other chronic pain: Secondary | ICD-10-CM | POA: Insufficient documentation

## 2022-05-02 DIAGNOSIS — R262 Difficulty in walking, not elsewhere classified: Secondary | ICD-10-CM | POA: Insufficient documentation

## 2022-05-02 DIAGNOSIS — M545 Low back pain, unspecified: Secondary | ICD-10-CM | POA: Insufficient documentation

## 2022-05-02 DIAGNOSIS — R278 Other lack of coordination: Secondary | ICD-10-CM | POA: Insufficient documentation

## 2022-05-02 DIAGNOSIS — R293 Abnormal posture: Secondary | ICD-10-CM | POA: Insufficient documentation

## 2022-05-02 DIAGNOSIS — M5459 Other low back pain: Secondary | ICD-10-CM | POA: Insufficient documentation

## 2022-05-02 DIAGNOSIS — M6281 Muscle weakness (generalized): Secondary | ICD-10-CM | POA: Insufficient documentation

## 2022-05-02 NOTE — Therapy (Signed)
OUTPATIENT PHYSICAL THERAPY THORACOLUMBAR EVALUATION   Patient Name: Paul Roberts MRN: [id number] DOB:05-11-1945, 76 y.o., male Today's Date: 05/02/2022  END OF SESSION:  PT End of Session - 05/02/22 1435     Visit Number 1    Date for PT Re-Evaluation 07/25/22    PT Start Time 1145    PT Stop Time 1220    PT Time Calculation (min) 35 min    Activity Tolerance Patient tolerated treatment well    Behavior During Therapy [facility] for tasks assessed/performed             Past Medical History:  Diagnosis Date   Diabetes mellitus without complication (HCC)    Hypertension    Past Surgical History:  Procedure Laterality Date   NO PAST SURGERIES     Patient Active Problem List   Diagnosis Date Noted   Pulmonary embolism (HCC) 04/18/2021   Elevated troponin 04/18/2021   Hypokalemia 04/18/2021   Diabetes (HCC) 04/18/2021   Essential hypertension 04/18/2021   Grief reaction 08/13/2019   Benign essential tremor 08/13/2019   Tremor, coarse 04/14/2019   Abnormal increased muscle tone 04/14/2019   Tremor of left hand 04/14/2019   Eye movement abnormality 04/14/2019   Unresolved grief 04/14/2019   Mixed action and resting tremor 04/14/2019    PCP: Merri Brunette, MD  REFERRING PROVIDER: Rodolph Bong, MD  REFERRING DIAG: M54.50,G89.29 (ICD-10-CM) - Chronic left-sided low back pain without sciatica   Rationale for Evaluation and Treatment: Rehabilitation  THERAPY DIAG:  Abnormal posture  Difficulty in walking, not elsewhere classified  Muscle weakness (generalized)  Other lack of coordination  Other low back pain  ONSET DATE: 05/01/22  SUBJECTIVE:                                                                                                                                                                                           SUBJECTIVE STATEMENT: Patient reports 1 year H/O L sided lower back pain. It impairs his sleeping, he is unable to lie on his L side.  He uses ointment, which helps temporarily.  PERTINENT HISTORY:  HPI: Pt is a 76 y/o male c/o back pain.  Of note, patient suffered bilat segmental PE December 2022.  Today, patient presents with back pain ongoing almost 1 year.  Patient locates pain to the L-side of his low back.    Radiating pain: no LE numbness/tingling: no LE weakness: no Aggravates: prolonged walking, trying to sleep Treatments tried: tylenol Relevant historical information: Hypertension.  Diabetes.  Tremor left hand.     PAIN:  Are you having pain? Yes: NPRS scale: 8/10 Pain location:  L lower back Pain description: sharp Aggravating factors: walking, sleeping on the L, increased activity. Relieving factors: cream, warm shower.  PRECAUTIONS: None  WEIGHT BEARING RESTRICTIONS: No  FALLS:  Has patient fallen in last 6 months? No  LIVING ENVIRONMENT: Lives with: lives alone Lives in: House/apartment Stairs: Yes, he has no difficulty managing steps Has following equipment at home: None  OCCUPATION: Retired  PLOF: Independent  PATIENT GOALS: Improve pain.  NEXT MD VISIT:   OBJECTIVE:   DIAGNOSTIC FINDINGS:  X-ray images lumbar spine obtained at primary care provider office on December 14 and provided by patient on a CD today personally and independently interpreted. Multilevel degenerative disc disease and mild facet DJD.  No acute fractures are visible   SCREENING FOR RED FLAGS: Bowel or bladder incontinence: No Spinal tumors: No Cauda equina syndrome: No Compression fracture: No Abdominal aneurysm: No  COGNITION: Overall cognitive status: Within functional limits for tasks assessed     SENSATION: Not tested  MUSCLE LENGTH: Hamstrings: Right 51 deg; Left 54 deg Thomas test: tight hip flexors B.  POSTURE: increased lumbar lordosis and anterior pelvic tilt  PALPATION: TTP lower thoracic and lumbar paraspinals B.  LUMBAR ROM: All movements painful.  AROM eval  Flexion Mid shin   Extension WFL  Right lateral flexion Above knee  Left lateral flexion Above knee  Right rotation WFL  Left rotation WFL   (Blank rows = not tested)  LOWER EXTREMITY ROM:   generally WFL, stiff.  LOWER EXTREMITY MMT:  4-/5 throughout   LUMBAR SPECIAL TESTS:  Straight leg raise test: Negative and Slump test: Negative  FUNCTIONAL TESTS:  5 times sit to stand: 37.65  GAIT: Distance walked: Clinic distances Assistive device utilized: None Level of assistance: Complete Independence Comments: antalgic, slow  TODAY'S TREATMENT:                                                                                                                              DATE:  05/02/22  Education Supine stretching-SKTC, DKTC, figure4, glut  PATIENT EDUCATION:  Education details: POC, HEP Person educated: Patient Education method: Programmer, multimedia, Facilities manager, and Handouts Education comprehension: verbalized understanding and returned demonstration  HOME EXERCISE PROGRAM:  [id number]  ASSESSMENT:  CLINICAL IMPRESSION: Patient is a 76 y.o. who was seen today for physical therapy evaluation and treatment for LBP.   OBJECTIVE IMPAIRMENTS: Abnormal gait, decreased activity tolerance, decreased coordination, decreased endurance, decreased mobility, difficulty walking, decreased ROM, decreased strength, increased muscle spasms, impaired flexibility, and pain.   ACTIVITY LIMITATIONS: carrying, lifting, bending, standing, squatting, sleeping, and locomotion level  PARTICIPATION LIMITATIONS: meal prep, cleaning, laundry, shopping, and community activity  PERSONAL FACTORS: Age and Past/current experiences are also affecting patient's functional outcome.   REHAB POTENTIAL: Good  CLINICAL DECISION MAKING: Stable/uncomplicated  EVALUATION COMPLEXITY: Low   GOALS: Goals reviewed with patient? Yes  SHORT TERM GOALS: Target date: 05/20/22  I with initial HEP Baseline: Goal status: INITIAL   LONG  TERM GOALS: Target date: 07/25/22  I with final HEP Baseline:  Goal status: INITIAL  2.  Complete 5x STS in < 12 sec Baseline: 36 Goal status: INITIAL  3.  Patient will report 75% decrease in back pain Baseline: 8/10 Goal status: INITIAL  4.  Paitent will ambulate x at least 400' with back pain < 3/10 Baseline:  Goal status: INITIAL  5.  Increase BLE strength to at least 4+/5 Baseline:  Goal status: INITIAL  PLAN:  PT FREQUENCY: 2x/week  PT DURATION: 12 weeks  PLANNED INTERVENTIONS: Therapeutic exercises, Therapeutic activity, Neuromuscular re-education, Balance training, Gait training, Patient/Family education, Self Care, Joint mobilization, Dry Needling, Electrical stimulation, Cryotherapy, Moist heat, Traction, Ionotophoresis 4mg /ml Dexamethasone, and Manual therapy.  PLAN FOR NEXT SESSION: Update Hep to include strength, acute back pain treatment.   , DPT 05/02/2022, 2:39 PM

## 2022-05-03 NOTE — Therapy (Signed)
OUTPATIENT PHYSICAL THERAPY THORACOLUMBAR EVALUATION   Patient Name: Paul Roberts MRN: [id number] DOB:12-Oct-1945, 76 y.o., male Today's Date: 05/04/2022  END OF SESSION:  PT End of Session - 05/04/22 1103     Visit Number 2    Date for PT Re-Evaluation 07/25/22    PT Start Time 1100    PT Stop Time 1145    PT Time Calculation (min) 45 min    Activity Tolerance Patient tolerated treatment well    Behavior During Therapy [facility] for tasks assessed/performed              Past Medical History:  Diagnosis Date   Diabetes mellitus without complication (HCC)    Hypertension    Past Surgical History:  Procedure Laterality Date   NO PAST SURGERIES     Patient Active Problem List   Diagnosis Date Noted   Pulmonary embolism (HCC) 04/18/2021   Elevated troponin 04/18/2021   Hypokalemia 04/18/2021   Diabetes (HCC) 04/18/2021   Essential hypertension 04/18/2021   Grief reaction 08/13/2019   Benign essential tremor 08/13/2019   Tremor, coarse 04/14/2019   Abnormal increased muscle tone 04/14/2019   Tremor of left hand 04/14/2019   Eye movement abnormality 04/14/2019   Unresolved grief 04/14/2019   Mixed action and resting tremor 04/14/2019    PCP: Merri Brunette, MD  REFERRING PROVIDER: Rodolph Bong, MD  REFERRING DIAG: M54.50,G89.29 (ICD-10-CM) - Chronic left-sided low back pain without sciatica   Rationale for Evaluation and Treatment: Rehabilitation  THERAPY DIAG:  Abnormal posture  Difficulty in walking, not elsewhere classified  Muscle weakness (generalized)  Other low back pain  Other lack of coordination  ONSET DATE: 05/01/22  SUBJECTIVE:                                                                                                                                                                                           SUBJECTIVE STATEMENT: I am okay, my pain comes and goes. Not really doing the exercises.   PERTINENT HISTORY:  HPI: Pt is a 76  y/o male c/o back pain.  Of note, patient suffered bilat segmental PE December 2022.  Today, patient presents with back pain ongoing almost 1 year.  Patient locates pain to the L-side of his low back.    Radiating pain: no LE numbness/tingling: no LE weakness: no Aggravates: prolonged walking, trying to sleep Treatments tried: tylenol Relevant historical information: Hypertension.  Diabetes.  Tremor left hand.     PAIN:  Are you having pain? Yes: NPRS scale: 8/10 Pain location: L lower back Pain description: sharp Aggravating factors: walking, sleeping on the L, increased  activity. Relieving factors: cream, warm shower.  PRECAUTIONS: None  WEIGHT BEARING RESTRICTIONS: No  FALLS:  Has patient fallen in last 6 months? No  LIVING ENVIRONMENT: Lives with: lives alone Lives in: House/apartment Stairs: Yes, he has no difficulty managing steps Has following equipment at home: None  OCCUPATION: Retired  PLOF: Independent  PATIENT GOALS: Improve pain.  NEXT MD VISIT:   OBJECTIVE:   DIAGNOSTIC FINDINGS:  X-ray images lumbar spine obtained at primary care provider office on December 14 and provided by patient on a CD today personally and independently interpreted. Multilevel degenerative disc disease and mild facet DJD.  No acute fractures are visible   SCREENING FOR RED FLAGS: Bowel or bladder incontinence: No Spinal tumors: No Cauda equina syndrome: No Compression fracture: No Abdominal aneurysm: No  COGNITION: Overall cognitive status: Within functional limits for tasks assessed     SENSATION: Not tested  MUSCLE LENGTH: Hamstrings: Right 51 deg; Left 54 deg Thomas test: tight hip flexors B.  POSTURE: increased lumbar lordosis and anterior pelvic tilt  PALPATION: TTP lower thoracic and lumbar paraspinals B.  LUMBAR ROM: All movements painful.  AROM eval  Flexion Mid shin  Extension WFL  Right lateral flexion Above knee  Left lateral flexion Above knee   Right rotation WFL  Left rotation WFL   (Blank rows = not tested)  LOWER EXTREMITY ROM:   generally WFL, stiff.  LOWER EXTREMITY MMT:  4-/5 throughout   LUMBAR SPECIAL TESTS:  Straight leg raise test: Negative and Slump test: Negative  FUNCTIONAL TESTS:  5 times sit to stand: 37.65  GAIT: Distance walked: Clinic distances Assistive device utilized: None Level of assistance: Complete Independence Comments: antalgic, slow  TODAY'S TREATMENT:                                                                                                                              DATE:  05/04/22 NuStep L4 x10mins Stretching HS, SK2C 30s  Bridges 2x10 Feet on pball rotations, knees to chest x10 STS 2x10 MH to lumbar spine  05/02/22  Education Supine stretching-SKTC, DKTC, figure4, glut  PATIENT EDUCATION:  Education details: POC, HEP Person educated: Patient Education method: Programmer, multimedia, Facilities manager, and Handouts Education comprehension: verbalized understanding and returned demonstration  HOME EXERCISE PROGRAM:  [id number]  ASSESSMENT:  CLINICAL IMPRESSION: Patient returns with no changes in pain. He states he is not doing exercises given to him on eval. He is slow with his movements today. Tactile and verbal cues needed with exercises as he has difficulty understanding most likely due to language barrier. Does better with demonstrations. We did some gentle low back mobility and strengthening today. Ended with moist heat as he c/o of some pain after interventions.   OBJECTIVE IMPAIRMENTS: Abnormal gait, decreased activity tolerance, decreased coordination, decreased endurance, decreased mobility, difficulty walking, decreased ROM, decreased strength, increased muscle spasms, impaired flexibility, and pain.   ACTIVITY LIMITATIONS: carrying, lifting, bending, standing, squatting, sleeping, and locomotion  level  PARTICIPATION LIMITATIONS: meal prep, cleaning, laundry,  shopping, and community activity  PERSONAL FACTORS: Age and Past/current experiences are also affecting patient's functional outcome.   REHAB POTENTIAL: Good  CLINICAL DECISION MAKING: Stable/uncomplicated  EVALUATION COMPLEXITY: Low   GOALS: Goals reviewed with patient? Yes  SHORT TERM GOALS: Target date: 05/20/22  I with initial HEP Baseline: Goal status: INITIAL   LONG TERM GOALS: Target date: 07/25/22  I with final HEP Baseline:  Goal status: INITIAL  2.  Complete 5x STS in < 12 sec Baseline: 36 Goal status: INITIAL  3.  Patient will report 75% decrease in back pain Baseline: 8/10 Goal status: INITIAL  4.  Paitent will ambulate x at least 400' with back pain < 3/10 Baseline:  Goal status: INITIAL  5.  Increase BLE strength to at least 4+/5 Baseline:  Goal status: INITIAL  PLAN:  PT FREQUENCY: 2x/week  PT DURATION: 12 weeks  PLANNED INTERVENTIONS: Therapeutic exercises, Therapeutic activity, Neuromuscular re-education, Balance training, Gait training, Patient/Family education, Self Care, Joint mobilization, Dry Needling, Electrical stimulation, Cryotherapy, Moist heat, Traction, Ionotophoresis 4mg /ml Dexamethasone, and Manual therapy.  PLAN FOR NEXT SESSION: acute back pain treatment, low back mobility, leg and back strengthening   , DPT 05/04/2022, 11:45 AM

## 2022-05-04 ENCOUNTER — Ambulatory Visit: Payer: Medicare Other

## 2022-05-04 DIAGNOSIS — M6281 Muscle weakness (generalized): Secondary | ICD-10-CM

## 2022-05-04 DIAGNOSIS — M5459 Other low back pain: Secondary | ICD-10-CM

## 2022-05-04 DIAGNOSIS — R278 Other lack of coordination: Secondary | ICD-10-CM

## 2022-05-04 DIAGNOSIS — R293 Abnormal posture: Secondary | ICD-10-CM

## 2022-05-04 DIAGNOSIS — R262 Difficulty in walking, not elsewhere classified: Secondary | ICD-10-CM

## 2022-05-08 ENCOUNTER — Ambulatory Visit: Payer: Medicare Other | Attending: Family Medicine | Admitting: Physical Therapy

## 2022-05-08 DIAGNOSIS — M6281 Muscle weakness (generalized): Secondary | ICD-10-CM | POA: Insufficient documentation

## 2022-05-08 DIAGNOSIS — R278 Other lack of coordination: Secondary | ICD-10-CM | POA: Insufficient documentation

## 2022-05-08 DIAGNOSIS — R293 Abnormal posture: Secondary | ICD-10-CM | POA: Insufficient documentation

## 2022-05-08 DIAGNOSIS — R262 Difficulty in walking, not elsewhere classified: Secondary | ICD-10-CM | POA: Insufficient documentation

## 2022-05-08 DIAGNOSIS — M5459 Other low back pain: Secondary | ICD-10-CM | POA: Insufficient documentation

## 2022-05-10 ENCOUNTER — Encounter: Payer: Self-pay | Admitting: Physical Therapy

## 2022-05-10 ENCOUNTER — Ambulatory Visit: Payer: Medicare Other | Admitting: Physical Therapy

## 2022-05-10 DIAGNOSIS — R293 Abnormal posture: Secondary | ICD-10-CM

## 2022-05-10 DIAGNOSIS — M5459 Other low back pain: Secondary | ICD-10-CM | POA: Diagnosis not present

## 2022-05-10 DIAGNOSIS — R278 Other lack of coordination: Secondary | ICD-10-CM | POA: Diagnosis not present

## 2022-05-10 DIAGNOSIS — R262 Difficulty in walking, not elsewhere classified: Secondary | ICD-10-CM | POA: Diagnosis not present

## 2022-05-10 DIAGNOSIS — M6281 Muscle weakness (generalized): Secondary | ICD-10-CM | POA: Diagnosis not present

## 2022-05-10 NOTE — Therapy (Signed)
OUTPATIENT PHYSICAL THERAPY THORACOLUMBAR EVALUATION   Patient Name: Paul Roberts MRN: [id number] DOB:09/11/1945, 77 y.o., male Today's Date: 05/10/2022  END OF SESSION:  PT End of Session - 05/10/22 1150     Visit Number 3    Date for PT Re-Evaluation 07/25/22    PT Start Time 1150    PT Stop Time 1230    PT Time Calculation (min) 40 min    Activity Tolerance Patient tolerated treatment well    Behavior During Therapy [facility] for tasks assessed/performed              Past Medical History:  Diagnosis Date   Diabetes mellitus without complication ([place])    Hypertension    Past Surgical History:  Procedure Laterality Date   NO PAST SURGERIES     Patient Active Problem List   Diagnosis Date Noted   Pulmonary embolism ([place]) 04/18/2021   Elevated troponin 04/18/2021   Hypokalemia 04/18/2021   Diabetes ([place]) 04/18/2021   Essential hypertension 04/18/2021   Grief reaction 08/13/2019   Benign essential tremor 08/13/2019   Tremor, coarse 04/14/2019   Abnormal increased muscle tone 04/14/2019   Tremor of left hand 04/14/2019   Eye movement abnormality 04/14/2019   Unresolved grief 04/14/2019   Mixed action and resting tremor 04/14/2019    PCP: Deland Pretty, MD  REFERRING PROVIDER: Gregor Hams, MD  REFERRING DIAG: M54.50,G89.29 (ICD-10-CM) - Chronic left-sided low back pain without sciatica   Rationale for Evaluation and Treatment: Rehabilitation  THERAPY DIAG:  Abnormal posture  Difficulty in walking, not elsewhere classified  Muscle weakness (generalized)  Other low back pain  ONSET DATE: 05/01/22  SUBJECTIVE:                                                                                                                                                                                           SUBJECTIVE STATEMENT: "Feeling some what" Took some medicine   PERTINENT HISTORY:  HPI: Pt is a 77 y/o male c/o back pain.  Of note, patient suffered bilat  segmental PE December 2022.  Today, patient presents with back pain ongoing almost 1 year.  Patient locates pain to the L-side of his low back.    Radiating pain: no LE numbness/tingling: no LE weakness: no Aggravates: prolonged walking, trying to sleep Treatments tried: tylenol Relevant historical information: Hypertension.  Diabetes.  Tremor left hand.     PAIN:  Are you having pain? Yes: NPRS scale: 7/10 Pain location: L lower back Pain description: sharp Aggravating factors: walking, sleeping on the L, increased activity. Relieving factors: cream, warm shower.  PRECAUTIONS: None  WEIGHT BEARING  RESTRICTIONS: No  FALLS:  Has patient fallen in last 6 months? No  LIVING ENVIRONMENT: Lives with: lives alone Lives in: House/apartment Stairs: Yes, he has no difficulty managing steps Has following equipment at home: None  OCCUPATION: Retired  PLOF: Independent  PATIENT GOALS: Improve pain.  NEXT MD VISIT:   OBJECTIVE:   DIAGNOSTIC FINDINGS:  X-ray images lumbar spine obtained at primary care provider office on December 14 and provided by patient on a CD today personally and independently interpreted. Multilevel degenerative disc disease and mild facet DJD.  No acute fractures are visible   SCREENING FOR RED FLAGS: Bowel or bladder incontinence: No Spinal tumors: No Cauda equina syndrome: No Compression fracture: No Abdominal aneurysm: No  COGNITION: Overall cognitive status: Within functional limits for tasks assessed     SENSATION: Not tested  MUSCLE LENGTH: Hamstrings: Right 51 deg; Left 54 deg Thomas test: tight hip flexors B.  POSTURE: increased lumbar lordosis and anterior pelvic tilt  PALPATION: TTP lower thoracic and lumbar paraspinals B.  LUMBAR ROM: All movements painful.  AROM eval  Flexion Mid shin  Extension WFL  Right lateral flexion Above knee  Left lateral flexion Above knee  Right rotation WFL  Left rotation WFL   (Blank rows =  not tested)  LOWER EXTREMITY ROM:   generally WFL, stiff.  LOWER EXTREMITY MMT:  4-/5 throughout   LUMBAR SPECIAL TESTS:  Straight leg raise test: Negative and Slump test: Negative  FUNCTIONAL TESTS:  5 times sit to stand: 37.65  GAIT: Distance walked: Clinic distances Assistive device utilized: None Level of assistance: Complete Independence Comments: antalgic, slow  TODAY'S TREATMENT:                                                                                                                              DATE:  05/10/22 Bike L1.5 x 5 min Bridges 2x10 Feet on pball rotations, bridges, knees to chest x10 Stretching hamstring, piriformis, single K2C, lower lumbar rotations MH to lumbar spine 88mins  05/04/22 NuStep L4 x36mins Stretching HS, SK2C 30s  Bridges 2x10 Feet on pball rotations, knees to chest x10 STS 2x10 MH to lumbar spine 34mins  05/02/22  Education Supine stretching-SKTC, DKTC, figure4, glut  PATIENT EDUCATION:  Education details: POC, HEP Person educated: Patient Education method: Consulting civil engineer, Media planner, and Handouts Education comprehension: verbalized understanding and returned demonstration  HOME EXERCISE PROGRAM:  [id number]  ASSESSMENT:  CLINICAL IMPRESSION: Pt has slow guarded mobility throughout session. Tactile and verbal cues needed with exercises as he has difficulty understanding most likely due to language barrier. Pt had a difficult time relaxing with stretching, often resisting passive movent.  Continued with  gentle low back mobility and strengthening today. Ended with moist heat as he c/o of some pain after interventions.   OBJECTIVE IMPAIRMENTS: Abnormal gait, decreased activity tolerance, decreased coordination, decreased endurance, decreased mobility, difficulty walking, decreased ROM, decreased strength, increased muscle spasms, impaired flexibility, and pain.   ACTIVITY  LIMITATIONS: carrying, lifting, bending, standing,  squatting, sleeping, and locomotion level  PARTICIPATION LIMITATIONS: meal prep, cleaning, laundry, shopping, and community activity  PERSONAL FACTORS: Age and Past/current experiences are also affecting patient's functional outcome.   REHAB POTENTIAL: Good  CLINICAL DECISION MAKING: Stable/uncomplicated  EVALUATION COMPLEXITY: Low   GOALS: Goals reviewed with patient? Yes  SHORT TERM GOALS: Target date: 05/20/22  I with initial HEP Baseline: Goal status: INITIAL   LONG TERM GOALS: Target date: 07/25/22  I with final HEP Baseline:  Goal status: INITIAL  2.  Complete 5x STS in < 12 sec Baseline: 36 Goal status: INITIAL  3.  Patient will report 75% decrease in back pain Baseline: 8/10 Goal status: INITIAL  4.  Paitent will ambulate x at least 400' with back pain < 3/10 Baseline:  Goal status: INITIAL  5.  Increase BLE strength to at least 4+/5 Baseline:  Goal status: INITIAL  PLAN:  PT FREQUENCY: 2x/week  PT DURATION: 12 weeks  PLANNED INTERVENTIONS: Therapeutic exercises, Therapeutic activity, Neuromuscular re-education, Balance training, Gait training, Patient/Family education, Self Care, Joint mobilization, Dry Needling, Electrical stimulation, Cryotherapy, Moist heat, Traction, Ionotophoresis 4mg /ml Dexamethasone, and Manual therapy.  PLAN FOR NEXT SESSION: acute back pain treatment, low back mobility, leg and back strengthening   , PTA 05/10/2022, 11:51 AM

## 2022-05-14 ENCOUNTER — Ambulatory Visit: Payer: Medicare Other | Admitting: Physical Therapy

## 2022-05-14 ENCOUNTER — Encounter: Payer: Self-pay | Admitting: Physical Therapy

## 2022-05-14 DIAGNOSIS — R278 Other lack of coordination: Secondary | ICD-10-CM

## 2022-05-14 DIAGNOSIS — M6281 Muscle weakness (generalized): Secondary | ICD-10-CM | POA: Diagnosis not present

## 2022-05-14 DIAGNOSIS — M5459 Other low back pain: Secondary | ICD-10-CM

## 2022-05-14 DIAGNOSIS — R293 Abnormal posture: Secondary | ICD-10-CM | POA: Diagnosis not present

## 2022-05-14 DIAGNOSIS — R262 Difficulty in walking, not elsewhere classified: Secondary | ICD-10-CM

## 2022-05-14 NOTE — Therapy (Signed)
OUTPATIENT PHYSICAL THERAPY THORACOLUMBAR EVALUATION   Patient Name: Paul Roberts MRN: [id number] DOB:February 03, 1946, 77 y.o., male Today's Date: 05/14/2022  END OF SESSION:  PT End of Session - 05/14/22 1157     Visit Number 4    Date for PT Re-Evaluation 07/25/22    PT Start Time 1152    PT Stop Time 1225    PT Time Calculation (min) 33 min    Activity Tolerance Patient tolerated treatment well    Behavior During Therapy [facility] for tasks assessed/performed               Past Medical History:  Diagnosis Date   Diabetes mellitus without complication (HCC)    Hypertension    Past Surgical History:  Procedure Laterality Date   NO PAST SURGERIES     Patient Active Problem List   Diagnosis Date Noted   Pulmonary embolism (HCC) 04/18/2021   Elevated troponin 04/18/2021   Hypokalemia 04/18/2021   Diabetes (HCC) 04/18/2021   Essential hypertension 04/18/2021   Grief reaction 08/13/2019   Benign essential tremor 08/13/2019   Tremor, coarse 04/14/2019   Abnormal increased muscle tone 04/14/2019   Tremor of left hand 04/14/2019   Eye movement abnormality 04/14/2019   Unresolved grief 04/14/2019   Mixed action and resting tremor 04/14/2019    PCP: Merri Brunette, MD  REFERRING PROVIDER: Rodolph Bong, MD  REFERRING DIAG: M54.50,G89.29 (ICD-10-CM) - Chronic left-sided low back pain without sciatica   Rationale for Evaluation and Treatment: Rehabilitation  THERAPY DIAG:  Abnormal posture  Difficulty in walking, not elsewhere classified  Other lack of coordination  Other low back pain  Muscle weakness (generalized)  ONSET DATE: 05/01/22  SUBJECTIVE:                                                                                                                                                                                           SUBJECTIVE STATEMENT: Patient reports that he thinks the medicine is helping him. The L sided pain is still present, it comes and  goes. He has not been performing HEP  PERTINENT HISTORY:  HPI: Pt is a 77 y/o male c/o back pain.  Of note, patient suffered bilat segmental PE December 2022.  Today, patient presents with back pain ongoing almost 1 year.  Patient locates pain to the L-side of his low back.    Radiating pain: no LE numbness/tingling: no LE weakness: no Aggravates: prolonged walking, trying to sleep Treatments tried: tylenol Relevant historical information: Hypertension.  Diabetes.  Tremor left hand.     PAIN:  Are you having pain? Yes: NPRS scale: 7/10 Pain location:  L lower back Pain description: sharp Aggravating factors: walking, sleeping on the L, increased activity. Relieving factors: cream, warm shower.  PRECAUTIONS: None  WEIGHT BEARING RESTRICTIONS: No  FALLS:  Has patient fallen in last 6 months? No  LIVING ENVIRONMENT: Lives with: lives alone Lives in: House/apartment Stairs: Yes, he has no difficulty managing steps Has following equipment at home: None  OCCUPATION: Retired  PLOF: Independent  PATIENT GOALS: Improve pain.  NEXT MD VISIT:   OBJECTIVE:   DIAGNOSTIC FINDINGS:  X-ray images lumbar spine obtained at primary care provider office on December 14 and provided by patient on a CD today personally and independently interpreted. Multilevel degenerative disc disease and mild facet DJD.  No acute fractures are visible   SCREENING FOR RED FLAGS: Bowel or bladder incontinence: No Spinal tumors: No Cauda equina syndrome: No Compression fracture: No Abdominal aneurysm: No  COGNITION: Overall cognitive status: Within functional limits for tasks assessed     SENSATION: Not tested  MUSCLE LENGTH: Hamstrings: Right 51 deg; Left 54 deg Thomas test: tight hip flexors B.  POSTURE: increased lumbar lordosis and anterior pelvic tilt  PALPATION: TTP lower thoracic and lumbar paraspinals B.  LUMBAR ROM: All movements painful.  AROM eval  Flexion Mid shin   Extension WFL  Right lateral flexion Above knee  Left lateral flexion Above knee  Right rotation WFL  Left rotation WFL   (Blank rows = not tested)  LOWER EXTREMITY ROM:   generally WFL, stiff.  LOWER EXTREMITY MMT:  4-/5 throughout   LUMBAR SPECIAL TESTS:  Straight leg raise test: Negative and Slump test: Negative  FUNCTIONAL TESTS:  5 times sit to stand: 37.65  GAIT: Distance walked: Clinic distances Assistive device utilized: None Level of assistance: Complete Independence Comments: antalgic, slow  TODAY'S TREATMENT:                                                                                                                              DATE:  05/14/22 Bike L3 x 4:30 Seated HS stretch, 3 x 30 sec each Seated lumbar mobilizations in ant/post plane Seated rotational reach out to each side and back x 3 for stretch and strength. Supine glut stretch B, 2 x 30 seconds Supine clamshells against G tband x 10 Supine bridge with hip abd against G tband x 10 LTR x 5 to each side, hold x 10 SKTC 2 x 15 sec each leg STS x 5, no hands MH to low back x 10 minutes   05/10/22 Bike L1.5 x 5 min Bridges 2x10 Feet on pball rotations, bridges, knees to chest x10 Stretching hamstring, piriformis, single K2C, lower lumbar rotations MH to lumbar spine  05/04/22 NuStep L4 x29mins Stretching HS, SK2C 30s  Bridges 2x10 Feet on pball rotations, knees to chest x10 STS 2x10 MH to lumbar spine  05/02/22  Education Supine stretching-SKTC, DKTC, figure4, glut  PATIENT EDUCATION:  Education details: POC, HEP Person educated: Patient  Education method: Explanation, Demonstration, and Handouts Education comprehension: verbalized understanding and returned demonstration  HOME EXERCISE PROGRAM:  [id number]  ASSESSMENT:  CLINICAL IMPRESSION: Pt arrived a few minutes late today. He reports continued LBP. He has not been performing HEP due to forgot. Re- printed it per his  request. Treatment continued to emphasize stretching and trunk stabilization activities to help control his pain and he was encouraged to perform HEP.  OBJECTIVE IMPAIRMENTS: Abnormal gait, decreased activity tolerance, decreased coordination, decreased endurance, decreased mobility, difficulty walking, decreased ROM, decreased strength, increased muscle spasms, impaired flexibility, and pain.   ACTIVITY LIMITATIONS: carrying, lifting, bending, standing, squatting, sleeping, and locomotion level  PARTICIPATION LIMITATIONS: meal prep, cleaning, laundry, shopping, and community activity  PERSONAL FACTORS: Age and Past/current experiences are also affecting patient's functional outcome.   REHAB POTENTIAL: Good  CLINICAL DECISION MAKING: Stable/uncomplicated  EVALUATION COMPLEXITY: Low   GOALS: Goals reviewed with patient? Yes  SHORT TERM GOALS: Target date: 05/20/22  I with initial HEP Baseline: Goal status: ongoing    LONG TERM GOALS: Target date: 07/25/22  I with final HEP Baseline:  Goal status: INITIAL  2.  Complete 5x STS in < 12 sec Baseline: 36 Goal status: ongoing  3.  Patient will report 75% decrease in back pain Baseline: 8/10 Goal status: INITIAL  4.  Paitent will ambulate x at least 400' with back pain < 3/10 Baseline:  Goal status: INITIAL  5.  Increase BLE strength to at least 4+/5 Baseline:  Goal status: INITIAL  PLAN:  PT FREQUENCY: 2x/week  PT DURATION: 12 weeks  PLANNED INTERVENTIONS: Therapeutic exercises, Therapeutic activity, Neuromuscular re-education, Balance training, Gait training, Patient/Family education, Self Care, Joint mobilization, Dry Needling, Electrical stimulation, Cryotherapy, Moist heat, Traction, Ionotophoresis 4mg /ml Dexamethasone, and Manual therapy.  PLAN FOR NEXT SESSION: acute back pain treatment, low back mobility, leg and back strengthening   Ethel Rana DPT 05/14/22 12:26 PM  05/14/2022, 12:26 PM

## 2022-05-16 ENCOUNTER — Encounter: Payer: Self-pay | Admitting: Physical Therapy

## 2022-05-16 ENCOUNTER — Ambulatory Visit: Payer: Medicare Other | Admitting: Physical Therapy

## 2022-05-16 DIAGNOSIS — R262 Difficulty in walking, not elsewhere classified: Secondary | ICD-10-CM | POA: Diagnosis not present

## 2022-05-16 DIAGNOSIS — R293 Abnormal posture: Secondary | ICD-10-CM | POA: Diagnosis not present

## 2022-05-16 DIAGNOSIS — M5459 Other low back pain: Secondary | ICD-10-CM

## 2022-05-16 DIAGNOSIS — R278 Other lack of coordination: Secondary | ICD-10-CM | POA: Diagnosis not present

## 2022-05-16 DIAGNOSIS — M6281 Muscle weakness (generalized): Secondary | ICD-10-CM | POA: Diagnosis not present

## 2022-05-16 NOTE — Therapy (Signed)
OUTPATIENT PHYSICAL THERAPY THORACOLUMBAR EVALUATION   Patient Name: Paul Roberts MRN: [id number] DOB:[birth year], 77 y.o., male Today's Date: 05/16/2022  END OF SESSION:  PT End of Session - 05/16/22 1155     Visit Number 5    Date for PT Re-Evaluation 07/25/22    PT Start Time 1155    PT Stop Time 1230    PT Time Calculation (min) 35 min    Activity Tolerance Patient tolerated treatment well    Behavior During Therapy [facility] for tasks assessed/performed               Past Medical History:  Diagnosis Date   Diabetes mellitus without complication ([employer])    Hypertension    Past Surgical History:  Procedure Laterality Date   NO PAST SURGERIES     Patient Active Problem List   Diagnosis Date Noted   Pulmonary embolism ([place]) 04/18/2021   Elevated troponin 04/18/2021   Hypokalemia 04/18/2021   Diabetes ([place]) 04/18/2021   Essential hypertension 04/18/2021   Grief reaction 08/13/2019   Benign essential tremor 08/13/2019   Tremor, coarse 04/14/2019   Abnormal increased muscle tone 04/14/2019   Tremor of left hand 04/14/2019   Eye movement abnormality 04/14/2019   Unresolved grief 04/14/2019   Mixed action and resting tremor 04/14/2019    PCP: Deland Pretty, MD  REFERRING PROVIDER: Gregor Hams, MD  REFERRING DIAG: M54.50,G89.29 (ICD-10-CM) - Chronic left-sided low back pain without sciatica   Rationale for Evaluation and Treatment: Rehabilitation  THERAPY DIAG:  Abnormal posture  Difficulty in walking, not elsewhere classified  Other low back pain  Muscle weakness (generalized)  ONSET DATE: 05/01/22  SUBJECTIVE:                                                                                                                                                                                           SUBJECTIVE STATEMENT: "Good"  PERTINENT HISTORY:  HPI: Pt is a 77 y/o male c/o back pain.  Of note, patient suffered bilat segmental PE December 2022.   Today, patient presents with back pain ongoing almost 1 year.  Patient locates pain to the L-side of his low back.    Radiating pain: no LE numbness/tingling: no LE weakness: no Aggravates: prolonged walking, trying to sleep Treatments tried: tylenol Relevant historical information: Hypertension.  Diabetes.  Tremor left hand.     PAIN:  Are you having pain? Yes: NPRS scale: 0/10 Pain location: L lower back Pain description: sharp Aggravating factors: walking, sleeping on the L, increased activity. Relieving factors: cream, warm shower.  PRECAUTIONS: None  WEIGHT BEARING RESTRICTIONS: No  FALLS:  Has patient fallen in last 6 months? No  LIVING ENVIRONMENT: Lives with: lives alone Lives in: House/apartment Stairs: Yes, he has no difficulty managing steps Has following equipment at home: None  OCCUPATION: Retired  PLOF: Independent  PATIENT GOALS: Improve pain.  NEXT MD VISIT:   OBJECTIVE:   DIAGNOSTIC FINDINGS:  X-ray images lumbar spine obtained at primary care provider office on December 14 and provided by patient on a CD today personally and independently interpreted. Multilevel degenerative disc disease and mild facet DJD.  No acute fractures are visible   SCREENING FOR RED FLAGS: Bowel or bladder incontinence: No Spinal tumors: No Cauda equina syndrome: No Compression fracture: No Abdominal aneurysm: No  COGNITION: Overall cognitive status: Within functional limits for tasks assessed     SENSATION: Not tested  MUSCLE LENGTH: Hamstrings: Right 51 deg; Left 54 deg Thomas test: tight hip flexors B.  POSTURE: increased lumbar lordosis and anterior pelvic tilt  PALPATION: TTP lower thoracic and lumbar paraspinals B.  LUMBAR ROM: All movements painful.  AROM eval  Flexion Mid shin  Extension WFL  Right lateral flexion Above knee  Left lateral flexion Above knee  Right rotation WFL  Left rotation WFL   (Blank rows = not tested)  LOWER  EXTREMITY ROM:   generally WFL, stiff.  LOWER EXTREMITY MMT:  4-/5 throughout   LUMBAR SPECIAL TESTS:  Straight leg raise test: Negative and Slump test: Negative  FUNCTIONAL TESTS:  5 times sit to stand: 37.65  GAIT: Distance walked: Clinic distances Assistive device utilized: None Level of assistance: Complete Independence Comments: antalgic, slow  TODAY'S TREATMENT:                                                                                                                              DATE:  05/16/22 S2S OHP yellow ball x10 Rows and Ext green 2x10 each Supine bridges x10 Ab sets 2x10 with pball LE on pball bridges, K2C, Oblq MHP to Lumbar spine during all supine interventions  05/14/22 Bike L3 x 4:30 Seated HS stretch, 3 x 30 sec each Seated lumbar mobilizations in ant/post plane Seated rotational reach out to each side and back x 3 for stretch and strength. Supine glut stretch B, 2 x 30 seconds Supine clamshells against G tband x 10 Supine bridge with hip abd against G tband x 10 LTR x 5 to each side, hold x 10 SKTC 2 x 15 sec each leg STS x 5, no hands MH to low back x 10 minutes   05/10/22 Bike L1.5 x 5 min Bridges 2x10 Feet on pball rotations, bridges, knees to chest x10 Stretching hamstring, piriformis, single K2C, lower lumbar rotations MH to lumbar spine  05/04/22 NuStep L4 x49mins Stretching HS, SK2C 30s  Bridges 2x10 Feet on pball rotations, knees to chest x10 STS 2x10 MH to lumbar spine  05/02/22  Education Supine stretching-SKTC, DKTC, figure4, glut  PATIENT EDUCATION:  Education details: POC,  HEP Person educated: Patient Education method: Explanation, Demonstration, and Handouts Education comprehension: verbalized understanding and returned demonstration  HOME EXERCISE PROGRAM:  [id number]  ASSESSMENT:  CLINICAL IMPRESSION: Pt arrived 10 minutes late today. He reports no low back pain. Treatment continued to emphasize  stretching and trunk stabilization activities. R knee pain reported with sit to stands. Constan postural cues required with rows and extensions. Cue for core engagement needed with supine interventions.  OBJECTIVE IMPAIRMENTS: Abnormal gait, decreased activity tolerance, decreased coordination, decreased endurance, decreased mobility, difficulty walking, decreased ROM, decreased strength, increased muscle spasms, impaired flexibility, and pain.   ACTIVITY LIMITATIONS: carrying, lifting, bending, standing, squatting, sleeping, and locomotion level  PARTICIPATION LIMITATIONS: meal prep, cleaning, laundry, shopping, and community activity  PERSONAL FACTORS: Age and Past/current experiences are also affecting patient's functional outcome.   REHAB POTENTIAL: Good  CLINICAL DECISION MAKING: Stable/uncomplicated  EVALUATION COMPLEXITY: Low   GOALS: Goals reviewed with patient? Yes  SHORT TERM GOALS: Target date: 05/20/22  I with initial HEP Baseline: Goal status: ongoing    LONG TERM GOALS: Target date: 07/25/22  I with final HEP Baseline:  Goal status: INITIAL  2.  Complete 5x STS in < 12 sec Baseline: 36 Goal status: ongoing  3.  Patient will report 75% decrease in back pain Baseline: 8/10 Goal status: INITIAL  4.  Paitent will ambulate x at least 400' with back pain < 3/10 Baseline:  Goal status: INITIAL  5.  Increase BLE strength to at least 4+/5 Baseline:  Goal status: INITIAL  PLAN:  PT FREQUENCY: 2x/week  PT DURATION: 12 weeks  PLANNED INTERVENTIONS: Therapeutic exercises, Therapeutic activity, Neuromuscular re-education, Balance training, Gait training, Patient/Family education, Self Care, Joint mobilization, Dry Needling, Electrical stimulation, Cryotherapy, Moist heat, Traction, Ionotophoresis 4mg /ml Dexamethasone, and Manual therapy.  PLAN FOR NEXT SESSION: acute back pain treatment, low back mobility, leg and back strengthening   Ethel Rana  DPT 05/16/22 11:56 AM  05/16/2022, 11:56 AM

## 2022-05-21 ENCOUNTER — Encounter: Payer: Self-pay | Admitting: Physical Therapy

## 2022-05-21 ENCOUNTER — Ambulatory Visit: Payer: Medicare Other | Admitting: Physical Therapy

## 2022-05-21 DIAGNOSIS — M6281 Muscle weakness (generalized): Secondary | ICD-10-CM | POA: Diagnosis not present

## 2022-05-21 DIAGNOSIS — R278 Other lack of coordination: Secondary | ICD-10-CM | POA: Diagnosis not present

## 2022-05-21 DIAGNOSIS — M5459 Other low back pain: Secondary | ICD-10-CM

## 2022-05-21 DIAGNOSIS — R293 Abnormal posture: Secondary | ICD-10-CM | POA: Diagnosis not present

## 2022-05-21 DIAGNOSIS — R262 Difficulty in walking, not elsewhere classified: Secondary | ICD-10-CM

## 2022-05-21 NOTE — Therapy (Signed)
OUTPATIENT PHYSICAL THERAPY THORACOLUMBAR EVALUATION   Patient Name: Paul Roberts MRN: [id number] DOB:03/06/1946, 77 y.o., male Today's Date: 05/21/2022  END OF SESSION:  PT End of Session - 05/21/22 1151     Visit Number 6    Date for PT Re-Evaluation 07/25/22    PT Start Time 1144    PT Stop Time 1225    PT Time Calculation (min) 41 min    Activity Tolerance Patient tolerated treatment well    Behavior During Therapy [facility] for tasks assessed/performed                Past Medical History:  Diagnosis Date   Diabetes mellitus without complication (HCC)    Hypertension    Past Surgical History:  Procedure Laterality Date   NO PAST SURGERIES     Patient Active Problem List   Diagnosis Date Noted   Pulmonary embolism (HCC) 04/18/2021   Elevated troponin 04/18/2021   Hypokalemia 04/18/2021   Diabetes (HCC) 04/18/2021   Essential hypertension 04/18/2021   Grief reaction 08/13/2019   Benign essential tremor 08/13/2019   Tremor, coarse 04/14/2019   Abnormal increased muscle tone 04/14/2019   Tremor of left hand 04/14/2019   Eye movement abnormality 04/14/2019   Unresolved grief 04/14/2019   Mixed action and resting tremor 04/14/2019    PCP: Merri Brunette, MD  REFERRING PROVIDER: Rodolph Bong, MD  REFERRING DIAG: M54.50,G89.29 (ICD-10-CM) - Chronic left-sided low back pain without sciatica   Rationale for Evaluation and Treatment: Rehabilitation  THERAPY DIAG:  Abnormal posture  Difficulty in walking, not elsewhere classified  Other low back pain  Other lack of coordination  Muscle weakness (generalized)  ONSET DATE: 05/01/22  SUBJECTIVE:                                                                                                                                                                                           SUBJECTIVE STATEMENT: "Good"  PERTINENT HISTORY:  HPI: Pt is a 77 y/o male c/o back pain.  Of note, patient suffered bilat  segmental PE December 2022.  Today, patient presents with back pain ongoing almost 1 year.  Patient locates pain to the L-side of his low back.    Radiating pain: no LE numbness/tingling: no LE weakness: no Aggravates: prolonged walking, trying to sleep Treatments tried: tylenol Relevant historical information: Hypertension.  Diabetes.  Tremor left hand.     PAIN:  Are you having pain? Yes: NPRS scale: 0/10 Pain location: L lower back Pain description: sharp Aggravating factors: walking, sleeping on the L, increased activity. Relieving factors: cream, warm shower.  PRECAUTIONS: None  WEIGHT  BEARING RESTRICTIONS: No  FALLS:  Has patient fallen in last 6 months? No  LIVING ENVIRONMENT: Lives with: lives alone Lives in: House/apartment Stairs: Yes, he has no difficulty managing steps Has following equipment at home: None  OCCUPATION: Retired  PLOF: Independent  PATIENT GOALS: Improve pain.  NEXT MD VISIT:   OBJECTIVE:   DIAGNOSTIC FINDINGS:  X-ray images lumbar spine obtained at primary care provider office on December 14 and provided by patient on a CD today personally and independently interpreted. Multilevel degenerative disc disease and mild facet DJD.  No acute fractures are visible   SCREENING FOR RED FLAGS: Bowel or bladder incontinence: No Spinal tumors: No Cauda equina syndrome: No Compression fracture: No Abdominal aneurysm: No  COGNITION: Overall cognitive status: Within functional limits for tasks assessed     SENSATION: Not tested  MUSCLE LENGTH: Hamstrings: Right 51 deg; Left 54 deg Thomas test: tight hip flexors B.  POSTURE: increased lumbar lordosis and anterior pelvic tilt  PALPATION: TTP lower thoracic and lumbar paraspinals B.  LUMBAR ROM: All movements painful.  AROM eval  Flexion Mid shin  Extension WFL  Right lateral flexion Above knee  Left lateral flexion Above knee  Right rotation WFL  Left rotation WFL   (Blank rows =  not tested)  LOWER EXTREMITY ROM:   generally WFL, stiff.  LOWER EXTREMITY MMT:  4-/5 throughout   LUMBAR SPECIAL TESTS:  Straight leg raise test: Negative and Slump test: Negative  FUNCTIONAL TESTS:  5 times sit to stand: 37.65  GAIT: Distance walked: Clinic distances Assistive device utilized: None Level of assistance: Complete Independence Comments: antalgic, slow  TODAY'S TREATMENT:                                                                                                                              DATE:  05/21/22 NuStep L5 x 6 minutes Supine BKTC over physioball LTR x 5 each way STM to L gluts followed by stretch to L glutes and piriformis Supine bridge over physioball x 10, repeat with hip IR Bridge and roll ball side to side x 5 B side to side stepping with g tband resistance at knees. Sit <> stand on air ex with G tband at knees and perform OHP with 2# ball. 2 x 5 reps Heel raises on step x 10 reps, BUE support for balance  05/16/22 S2S OHP yellow ball x10 Rows and Ext green 2x10 each Supine bridges x10 Ab sets 2x10 with pball LE on pball bridges, K2C, Oblq MHP to Lumbar spine during all supine interventions  05/14/22 Bike L3 x 4:30 Seated HS stretch, 3 x 30 sec each Seated lumbar mobilizations in ant/post plane Seated rotational reach out to each side and back x 3 for stretch and strength. Supine glut stretch B, 2 x 30 seconds Supine clamshells against G tband x 10 Supine bridge with hip abd against G tband x 10 LTR x 5 to each side, hold  x 10 SKTC 2 x 15 sec each leg STS x 5, no hands MH to low back x 10 minutes   05/10/22 Bike L1.5 x 5 min Bridges 2x10 Feet on pball rotations, bridges, knees to chest x10 Stretching hamstring, piriformis, single K2C, lower lumbar rotations MH to lumbar spine 54mins  05/04/22 NuStep L4 x46mins Stretching HS, SK2C 30s  Bridges 2x10 Feet on pball rotations, knees to chest x10 STS 2x10 MH to lumbar spine  51mins  05/02/22  Education Supine stretching-SKTC, DKTC, figure4, glut  PATIENT EDUCATION:  Education details: POC, HEP Person educated: Patient Education method: Consulting civil engineer, Media planner, and Handouts Education comprehension: verbalized understanding and returned demonstration  HOME EXERCISE PROGRAM:  [id number]  ASSESSMENT:  CLINICAL IMPRESSION: Pt reports some improvement overall, still feels pain at times. Treatment focused on some STM and stretch to L hip muscles followed by strengthening for lower body. He tolerated increased exercises, required rest breaks.  OBJECTIVE IMPAIRMENTS: Abnormal gait, decreased activity tolerance, decreased coordination, decreased endurance, decreased mobility, difficulty walking, decreased ROM, decreased strength, increased muscle spasms, impaired flexibility, and pain.   ACTIVITY LIMITATIONS: carrying, lifting, bending, standing, squatting, sleeping, and locomotion level  PARTICIPATION LIMITATIONS: meal prep, cleaning, laundry, shopping, and community activity  PERSONAL FACTORS: Age and Past/current experiences are also affecting patient's functional outcome.   REHAB POTENTIAL: Good  CLINICAL DECISION MAKING: Stable/uncomplicated  EVALUATION COMPLEXITY: Low   GOALS: Goals reviewed with patient? Yes  SHORT TERM GOALS: Target date: 05/20/22  I with initial HEP Baseline: Goal status: ongoing    LONG TERM GOALS: Target date: 07/25/22  I with final HEP Baseline:  Goal status: INITIAL  2.  Complete 5x STS in < 12 sec Baseline: 36 Goal status: ongoing  3.  Patient will report 75% decrease in back pain Baseline: 8/10 Goal status: INITIAL  4.  Paitent will ambulate x at least 400' with back pain < 3/10 Baseline:  Goal status: INITIAL  5.  Increase BLE strength to at least 4+/5 Baseline:  Goal status: INITIAL  PLAN:  PT FREQUENCY: 2x/week  PT DURATION: 12 weeks  PLANNED INTERVENTIONS: Therapeutic exercises,  Therapeutic activity, Neuromuscular re-education, Balance training, Gait training, Patient/Family education, Self Care, Joint mobilization, Dry Needling, Electrical stimulation, Cryotherapy, Moist heat, Traction, Ionotophoresis 4mg /ml Dexamethasone, and Manual therapy.  PLAN FOR NEXT SESSION: acute back pain treatment, low back mobility, leg and back strengthening   Ethel Rana DPT 05/21/22 12:23 PM  05/21/2022, 12:23 PM

## 2022-05-23 ENCOUNTER — Ambulatory Visit: Payer: Medicare Other | Admitting: Physical Therapy

## 2022-05-28 ENCOUNTER — Ambulatory Visit: Payer: Medicare Other | Admitting: Physical Therapy

## 2022-05-30 ENCOUNTER — Ambulatory Visit: Payer: Medicare Other | Admitting: Physical Therapy

## 2022-05-30 ENCOUNTER — Encounter: Payer: Self-pay | Admitting: Physical Therapy

## 2022-05-30 DIAGNOSIS — R293 Abnormal posture: Secondary | ICD-10-CM | POA: Diagnosis not present

## 2022-05-30 DIAGNOSIS — R278 Other lack of coordination: Secondary | ICD-10-CM

## 2022-05-30 DIAGNOSIS — R262 Difficulty in walking, not elsewhere classified: Secondary | ICD-10-CM

## 2022-05-30 DIAGNOSIS — M5459 Other low back pain: Secondary | ICD-10-CM

## 2022-05-30 DIAGNOSIS — M6281 Muscle weakness (generalized): Secondary | ICD-10-CM | POA: Diagnosis not present

## 2022-05-30 NOTE — Therapy (Signed)
OUTPATIENT PHYSICAL THERAPY THORACOLUMBAR EVALUATION   Patient Name: Paul Roberts MRN: [id number] DOB:1945-05-15, 77 y.o., male Today's Date: 05/30/2022  END OF SESSION:  PT End of Session - 05/30/22 1203     Visit Number 7    Date for PT Re-Evaluation 07/25/22    PT Start Time 1147    PT Stop Time 1227    PT Time Calculation (min) 40 min    Activity Tolerance Patient tolerated treatment well    Behavior During Therapy [facility] for tasks assessed/performed            Past Medical History:  Diagnosis Date   Diabetes mellitus without complication (HCC)    Hypertension    Past Surgical History:  Procedure Laterality Date   NO PAST SURGERIES     Patient Active Problem List   Diagnosis Date Noted   Pulmonary embolism (HCC) 04/18/2021   Elevated troponin 04/18/2021   Hypokalemia 04/18/2021   Diabetes (HCC) 04/18/2021   Essential hypertension 04/18/2021   Grief reaction 08/13/2019   Benign essential tremor 08/13/2019   Tremor, coarse 04/14/2019   Abnormal increased muscle tone 04/14/2019   Tremor of left hand 04/14/2019   Eye movement abnormality 04/14/2019   Unresolved grief 04/14/2019   Mixed action and resting tremor 04/14/2019    PCP: Merri Brunette, MD  REFERRING PROVIDER: Rodolph Bong, MD  REFERRING DIAG: M54.50,G89.29 (ICD-10-CM) - Chronic left-sided low back pain without sciatica   Rationale for Evaluation and Treatment: Rehabilitation  THERAPY DIAG:  Difficulty in walking, not elsewhere classified  Other low back pain  Other lack of coordination  Muscle weakness (generalized)  ONSET DATE: 05/01/22  SUBJECTIVE:                                                                                                                                                                                           SUBJECTIVE STATEMENT: "Good"  PERTINENT HISTORY:  HPI: Pt is a 77 y/o male c/o back pain.  Of note, patient suffered bilat segmental PE December 2022.   Today, patient presents with back pain ongoing almost 1 year.  Patient locates pain to the L-side of his low back.    Radiating pain: no LE numbness/tingling: no LE weakness: no Aggravates: prolonged walking, trying to sleep Treatments tried: tylenol Relevant historical information: Hypertension.  Diabetes.  Tremor left hand.     PAIN:  Are you having pain? Yes: NPRS scale: 0/10 Pain location: L lower back Pain description: sharp Aggravating factors: walking, sleeping on the L, increased activity. Relieving factors: cream, warm shower.  PRECAUTIONS: None  WEIGHT BEARING RESTRICTIONS: No  FALLS:  Has  patient fallen in last 6 months? No  LIVING ENVIRONMENT: Lives with: lives alone Lives in: House/apartment Stairs: Yes, he has no difficulty managing steps Has following equipment at home: None  OCCUPATION: Retired  PLOF: Independent  PATIENT GOALS: Improve pain.  NEXT MD VISIT:   OBJECTIVE:   DIAGNOSTIC FINDINGS:  X-ray images lumbar spine obtained at primary care provider office on December 14 and provided by patient on a CD today personally and independently interpreted. Multilevel degenerative disc disease and mild facet DJD.  No acute fractures are visible   SCREENING FOR RED FLAGS: Bowel or bladder incontinence: No Spinal tumors: No Cauda equina syndrome: No Compression fracture: No Abdominal aneurysm: No  COGNITION: Overall cognitive status: Within functional limits for tasks assessed     SENSATION: Not tested  MUSCLE LENGTH: Hamstrings: Right 51 deg; Left 54 deg Thomas test: tight hip flexors B.  POSTURE: increased lumbar lordosis and anterior pelvic tilt  PALPATION: TTP lower thoracic and lumbar paraspinals B.  LUMBAR ROM: All movements painful.  AROM eval  Flexion Mid shin  Extension WFL  Right lateral flexion Above knee  Left lateral flexion Above knee  Right rotation WFL  Left rotation WFL   (Blank rows = not tested)  LOWER  EXTREMITY ROM:   generally WFL, stiff.  LOWER EXTREMITY MMT:  4-/5 throughout   LUMBAR SPECIAL TESTS:  Straight leg raise test: Negative and Slump test: Negative  FUNCTIONAL TESTS:  5 times sit to stand: 37.65  GAIT: Distance walked: Clinic distances Assistive device utilized: None Level of assistance: Complete Independence Comments: antalgic, slow  TODAY'S TREATMENT:                                                                                                                              DATE:  05/30/22 Seated LE and hip stretching, HS, figure 4, leg cross, 2 x 30 sec each leg. Sit to stand with Gtband at hips, 4# ball, chest press at top, 10 reps Seated trunk ext against black theraband 10 reps Seated rows, 15# 10 reps Lat pulls, 15#, 10 reps Pect stretch in doorway Step ups onto 6" step, 2 x 5 with each leg.  05/21/22 NuStep L5 x 6 minutes Supine BKTC over physioball LTR x 5 each way STM to L gluts followed by stretch to L glutes and piriformis Supine bridge over physioball x 10, repeat with hip IR Bridge and roll ball side to side x 5 B side to side stepping with g tband resistance at knees. Sit <> stand on air ex with G tband at knees and perform OHP with 2# ball. 2 x 5 reps Heel raises on step x 10 reps, BUE support for balance  05/16/22 S2S OHP yellow ball x10 Rows and Ext green 2x10 each Supine bridges x10 Ab sets 2x10 with pball LE on pball bridges, K2C, Oblq MHP to Lumbar spine during all supine interventions  05/14/22 Bike L3 x 4:30 Seated HS  stretch, 3 x 30 sec each Seated lumbar mobilizations in ant/post plane Seated rotational reach out to each side and back x 3 for stretch and strength. Supine glut stretch B, 2 x 30 seconds Supine clamshells against G tband x 10 Supine bridge with hip abd against G tband x 10 LTR x 5 to each side, hold x 10 SKTC 2 x 15 sec each leg STS x 5, no hands MH to low back x 10 minutes  05/10/22 Bike L1.5 x 5 min Bridges  2x10 Feet on pball rotations, bridges, knees to chest x10 Stretching hamstring, piriformis, single K2C, lower lumbar rotations MH to lumbar spine 27mins  05/04/22 NuStep L4 x68mins Stretching HS, SK2C 30s  Bridges 2x10 Feet on pball rotations, knees to chest x10 STS 2x10 MH to lumbar spine 48mins  05/02/22  Education Supine stretching-SKTC, DKTC, figure4, glut  PATIENT EDUCATION:  Education details: POC, HEP Person educated: Patient Education method: Consulting civil engineer, Media planner, and Handouts Education comprehension: verbalized understanding and returned demonstration  HOME EXERCISE PROGRAM:  [id number]  ASSESSMENT:  CLINICAL IMPRESSION: Pt reports some improvement overall, still feels pain at times. He reports difficulty getting himself to perform the HEP exercises. He returned demo of some seated stretches for hips and low back, then proceeded to strength for upper and lower body with good form throughout. Encouraged patient to perform HEP exercises daily, tried to give him some suggestions to make it easier to incorporate sucha s the seated stretches.  OBJECTIVE IMPAIRMENTS: Abnormal gait, decreased activity tolerance, decreased coordination, decreased endurance, decreased mobility, difficulty walking, decreased ROM, decreased strength, increased muscle spasms, impaired flexibility, and pain.   ACTIVITY LIMITATIONS: carrying, lifting, bending, standing, squatting, sleeping, and locomotion level  PARTICIPATION LIMITATIONS: meal prep, cleaning, laundry, shopping, and community activity  PERSONAL FACTORS: Age and Past/current experiences are also affecting patient's functional outcome.   REHAB POTENTIAL: Good  CLINICAL DECISION MAKING: Stable/uncomplicated  EVALUATION COMPLEXITY: Low   GOALS: Goals reviewed with patient? Yes  SHORT TERM GOALS: Target date: 05/20/22  I with initial HEP Baseline: Goal status: ongoing    LONG TERM GOALS: Target date: 07/25/22  I with  final HEP Baseline:  Goal status: INITIAL  2.  Complete 5x STS in < 12 sec Baseline: 36 Goal status: ongoing  3.  Patient will report 75% decrease in back pain Baseline: 8/10 Goal status: ongoing  4.  Paitent will ambulate x at least 400' with back pain < 3/10 Baseline:  Goal status: ongoing  5.  Increase BLE strength to at least 4+/5 Baseline:  Goal status: ongoing  PLAN:  PT FREQUENCY: 2x/week  PT DURATION: 12 weeks  PLANNED INTERVENTIONS: Therapeutic exercises, Therapeutic activity, Neuromuscular re-education, Balance training, Gait training, Patient/Family education, Self Care, Joint mobilization, Dry Needling, Electrical stimulation, Cryotherapy, Moist heat, Traction, Ionotophoresis 4mg /ml Dexamethasone, and Manual therapy.  PLAN FOR NEXT SESSION: acute back pain treatment, low back mobility, leg and back strengthening   Ethel Rana DPT 05/30/22 12:24 PM  05/30/2022, 12:24 PM

## 2022-06-04 ENCOUNTER — Encounter: Payer: Self-pay | Admitting: Physical Therapy

## 2022-06-04 ENCOUNTER — Ambulatory Visit: Payer: Medicare Other | Admitting: Physical Therapy

## 2022-06-04 DIAGNOSIS — M5459 Other low back pain: Secondary | ICD-10-CM

## 2022-06-04 DIAGNOSIS — R278 Other lack of coordination: Secondary | ICD-10-CM | POA: Diagnosis not present

## 2022-06-04 DIAGNOSIS — R293 Abnormal posture: Secondary | ICD-10-CM

## 2022-06-04 DIAGNOSIS — M6281 Muscle weakness (generalized): Secondary | ICD-10-CM

## 2022-06-04 DIAGNOSIS — R262 Difficulty in walking, not elsewhere classified: Secondary | ICD-10-CM

## 2022-06-04 NOTE — Therapy (Signed)
OUTPATIENT PHYSICAL THERAPY THORACOLUMBAR EVALUATION   Patient Name: SEMIR BRILL MRN: [id number] DOB:[birth year], 77 y.o., male Today's Date: 06/04/2022  END OF SESSION:  PT End of Session - 06/04/22 1152     Visit Number 8    Date for PT Re-Evaluation 07/25/22    PT Start Time 1145    PT Stop Time 1225    PT Time Calculation (min) 40 min    Activity Tolerance Patient tolerated treatment well    Behavior During Therapy [facility] for tasks assessed/performed            Past Medical History:  Diagnosis Date   Diabetes mellitus without complication ([place])    Hypertension    Past Surgical History:  Procedure Laterality Date   NO PAST SURGERIES     Patient Active Problem List   Diagnosis Date Noted   Pulmonary embolism ([place]) 04/18/2021   Elevated troponin 04/18/2021   Hypokalemia 04/18/2021   Diabetes ([place]) 04/18/2021   Essential hypertension 04/18/2021   Grief reaction 08/13/2019   Benign essential tremor 08/13/2019   Tremor, coarse 04/14/2019   Abnormal increased muscle tone 04/14/2019   Tremor of left hand 04/14/2019   Eye movement abnormality 04/14/2019   Unresolved grief 04/14/2019   Mixed action and resting tremor 04/14/2019    PCP: Deland Pretty, MD  REFERRING PROVIDER: Gregor Hams, MD  REFERRING DIAG: M54.50,G89.29 (ICD-10-CM) - Chronic left-sided low back pain without sciatica   Rationale for Evaluation and Treatment: Rehabilitation  THERAPY DIAG:  Difficulty in walking, not elsewhere classified  Other low back pain  Other lack of coordination  Abnormal posture  Muscle weakness (generalized)  ONSET DATE: 05/01/22  SUBJECTIVE:                                                                                                                                                                                           SUBJECTIVE STATEMENT: Patient reports new onset of mild R lower back pain  PERTINENT HISTORY:  HPI: Pt is a 77 y/o male c/o back  pain.  Of note, patient suffered bilat segmental PE December 2022.  Today, patient presents with back pain ongoing almost 1 year.  Patient locates pain to the L-side of his low back.    Radiating pain: no LE numbness/tingling: no LE weakness: no Aggravates: prolonged walking, trying to sleep Treatments tried: tylenol Relevant historical information: Hypertension.  Diabetes.  Tremor left hand.     PAIN:  Are you having pain? Yes: NPRS scale: 0/10 Pain location: L lower back Pain description: sharp Aggravating factors: walking, sleeping on the L, increased activity. Relieving factors: cream, warm shower.  PRECAUTIONS: None  WEIGHT BEARING RESTRICTIONS: No  FALLS:  Has patient fallen in last 6 months? No  LIVING ENVIRONMENT: Lives with: lives alone Lives in: House/apartment Stairs: Yes, he has no difficulty managing steps Has following equipment at home: None  OCCUPATION: Retired  PLOF: Independent  PATIENT GOALS: Improve pain.  NEXT MD VISIT:   OBJECTIVE:   DIAGNOSTIC FINDINGS:  X-ray images lumbar spine obtained at primary care provider office on December 14 and provided by patient on a CD today personally and independently interpreted. Multilevel degenerative disc disease and mild facet DJD.  No acute fractures are visible   SCREENING FOR RED FLAGS: Bowel or bladder incontinence: No Spinal tumors: No Cauda equina syndrome: No Compression fracture: No Abdominal aneurysm: No  COGNITION: Overall cognitive status: Within functional limits for tasks assessed     SENSATION: Not tested  MUSCLE LENGTH: Hamstrings: Right 51 deg; Left 54 deg Thomas test: tight hip flexors B.  POSTURE: increased lumbar lordosis and anterior pelvic tilt  PALPATION: TTP lower thoracic and lumbar paraspinals B.  LUMBAR ROM: All movements painful.  AROM eval  Flexion Mid shin  Extension WFL  Right lateral flexion Above knee  Left lateral flexion Above knee  Right rotation  WFL  Left rotation WFL   (Blank rows = not tested)  LOWER EXTREMITY ROM:   generally WFL, stiff.  LOWER EXTREMITY MMT:  4-/5 throughout   LUMBAR SPECIAL TESTS:  Straight leg raise test: Negative and Slump test: Negative  FUNCTIONAL TESTS:  5 times sit to stand: 37.65  GAIT: Distance walked: Clinic distances Assistive device utilized: None Level of assistance: Complete Independence Comments: antalgic, slow  TODAY'S TREATMENT:                                                                                                                              DATE:  06/04/22 NuStep L3 x 6 minutes Supine LTR, 3 x 15 sec to each side. Lower R palpation, with mild TTP on R QL Supine bridge x 10 reps Supine isometric hip abd and add x 10 Seated rotation to R, slide hands out from trunk and return x 5, repeat to L Physiodisc lower body motion- Patient was fearful and was unable to isolate lower body, moved upper trunk. Seated rotation to R, forward and back weight shifts over hands, emphasizing lower trunk motion. 5 reps to each side. Trunk flexion in sit over physio ball, 5 reps forward, to R, to L.  05/30/22 Seated LE and hip stretching, HS, figure 4, leg cross, 2 x 30 sec each leg. Sit to stand with Gtband at knees, 4# ball, chest press at top, 10 reps Seated trunk ext against black theraband 10 reps Seated rows, 15# 10 reps Lat pulls, 15#, 10 reps Pect stretch in doorway Step ups onto 6" step, 2 x 5 with each leg.  05/21/22 NuStep L5 x 6 minutes Supine BKTC over physioball LTR x 5 each way STM  to L gluts followed by stretch to L glutes and piriformis Supine bridge over physioball x 10, repeat with hip IR Bridge and roll ball side to side x 5 B side to side stepping with g tband resistance at knees. Sit <> stand on air ex with G tband at knees and perform OHP with 2# ball. 2 x 5 reps Heel raises on step x 10 reps, BUE support for balance  05/16/22 S2S OHP yellow ball x10 Rows  and Ext green 2x10 each Supine bridges x10 Ab sets 2x10 with pball LE on pball bridges, K2C, Oblq MHP to Lumbar spine during all supine interventions  05/14/22 Bike L3 x 4:30 Seated HS stretch, 3 x 30 sec each Seated lumbar mobilizations in ant/post plane Seated rotational reach out to each side and back x 3 for stretch and strength. Supine glut stretch B, 2 x 30 seconds Supine clamshells against G tband x 10 Supine bridge with hip abd against G tband x 10 LTR x 5 to each side, hold x 10 SKTC 2 x 15 sec each leg STS x 5, no hands MH to low back x 10 minutes  05/10/22 Bike L1.5 x 5 min Bridges 2x10 Feet on pball rotations, bridges, knees to chest x10 Stretching hamstring, piriformis, single K2C, lower lumbar rotations MH to lumbar spine  05/04/22 NuStep L4 x58mins Stretching HS, SK2C 30s  Bridges 2x10 Feet on pball rotations, knees to chest x10 STS 2x10 MH to lumbar spine  05/02/22  Education Supine stretching-SKTC, DKTC, figure4, glut  PATIENT EDUCATION:  Education details: POC, HEP Person educated: Patient Education method: Programmer, multimedia, Facilities manager, and Handouts Education comprehension: verbalized understanding and returned demonstration  HOME EXERCISE PROGRAM:  [id number]  ASSESSMENT:  CLINICAL IMPRESSION: Pt reports new R lower back pain, but able to fully participate. Tried to include some stretch to his lower back, continued with strength, trunk stability exercises.  OBJECTIVE IMPAIRMENTS: Abnormal gait, decreased activity tolerance, decreased coordination, decreased endurance, decreased mobility, difficulty walking, decreased ROM, decreased strength, increased muscle spasms, impaired flexibility, and pain.   ACTIVITY LIMITATIONS: carrying, lifting, bending, standing, squatting, sleeping, and locomotion level  PARTICIPATION LIMITATIONS: meal prep, cleaning, laundry, shopping, and community activity  PERSONAL FACTORS: Age and Past/current  experiences are also affecting patient's functional outcome.   REHAB POTENTIAL: Good  CLINICAL DECISION MAKING: Stable/uncomplicated  EVALUATION COMPLEXITY: Low   GOALS: Goals reviewed with patient? Yes  SHORT TERM GOALS: Target date: 05/20/22  I with initial HEP Baseline: Goal status: ongoing    LONG TERM GOALS: Target date: 07/25/22  I with final HEP Baseline:  Goal status: INITIAL  2.  Complete 5x STS in < 12 sec Baseline: 36 Goal status: ongoing  3.  Patient will report 75% decrease in back pain Baseline: 8/10 Goal status: ongoing  4.  Paitent will ambulate x at least 400' with back pain < 3/10 Baseline:  Goal status: ongoing  5.  Increase BLE strength to at least 4+/5 Baseline:  Goal status: ongoing  PLAN:  PT FREQUENCY: 2x/week  PT DURATION: 12 weeks  PLANNED INTERVENTIONS: Therapeutic exercises, Therapeutic activity, Neuromuscular re-education, Balance training, Gait training, Patient/Family education, Self Care, Joint mobilization, Dry Needling, Electrical stimulation, Cryotherapy, Moist heat, Traction, Ionotophoresis 4mg /ml Dexamethasone, and Manual therapy.  PLAN FOR NEXT SESSION: acute back pain treatment, low back mobility, leg and back strengthening   DPT

## 2022-06-06 ENCOUNTER — Ambulatory Visit: Payer: Medicare Other | Admitting: Physical Therapy

## 2022-06-06 ENCOUNTER — Encounter: Payer: Self-pay | Admitting: Physical Therapy

## 2022-06-06 DIAGNOSIS — M5459 Other low back pain: Secondary | ICD-10-CM | POA: Diagnosis not present

## 2022-06-06 DIAGNOSIS — R278 Other lack of coordination: Secondary | ICD-10-CM | POA: Diagnosis not present

## 2022-06-06 DIAGNOSIS — M6281 Muscle weakness (generalized): Secondary | ICD-10-CM | POA: Diagnosis not present

## 2022-06-06 DIAGNOSIS — R262 Difficulty in walking, not elsewhere classified: Secondary | ICD-10-CM

## 2022-06-06 DIAGNOSIS — R293 Abnormal posture: Secondary | ICD-10-CM

## 2022-06-06 NOTE — Therapy (Signed)
OUTPATIENT PHYSICAL THERAPY THORACOLUMBAR EVALUATION   Patient Name: Paul Roberts MRN: [id number] DOB:Aug 08, 1945, 77 y.o., male Today's Date: 06/06/2022  END OF SESSION:  PT End of Session - 06/06/22 1205     Visit Number 9    Date for PT Re-Evaluation 07/25/22    PT Start Time 1202    PT Stop Time 1226    PT Time Calculation (min) 24 min    Activity Tolerance Patient tolerated treatment well    Behavior During Therapy [facility] for tasks assessed/performed             Past Medical History:  Diagnosis Date   Diabetes mellitus without complication ([place])    Hypertension    Past Surgical History:  Procedure Laterality Date   NO PAST SURGERIES     Patient Active Problem List   Diagnosis Date Noted   Pulmonary embolism ([place]) 04/18/2021   Elevated troponin 04/18/2021   Hypokalemia 04/18/2021   Diabetes ([place]) 04/18/2021   Essential hypertension 04/18/2021   Grief reaction 08/13/2019   Benign essential tremor 08/13/2019   Tremor, coarse 04/14/2019   Abnormal increased muscle tone 04/14/2019   Tremor of left hand 04/14/2019   Eye movement abnormality 04/14/2019   Unresolved grief 04/14/2019   Mixed action and resting tremor 04/14/2019    PCP: Deland Pretty, MD  REFERRING PROVIDER: Gregor Hams, MD  REFERRING DIAG: M54.50,G89.29 (ICD-10-CM) - Chronic left-sided low back pain without sciatica   Rationale for Evaluation and Treatment: Rehabilitation  THERAPY DIAG:  Difficulty in walking, not elsewhere classified  Other low back pain  Other lack of coordination  Muscle weakness (generalized)  Abnormal posture  ONSET DATE: 05/01/22  SUBJECTIVE:                                                                                                                                                                                           SUBJECTIVE STATEMENT: Patient reports no changes. He is walking for exercise and able to go further than before, but was very  tired.  PERTINENT HISTORY:  HPI: Pt is a 77 y/o male c/o back pain.  Of note, patient suffered bilat segmental PE December 2022.  Today, patient presents with back pain ongoing almost 1 year.  Patient locates pain to the L-side of his low back.    Radiating pain: no LE numbness/tingling: no LE weakness: no Aggravates: prolonged walking, trying to sleep Treatments tried: tylenol Relevant historical information: Hypertension.  Diabetes.  Tremor left hand.     PAIN:  Are you having pain? Yes: NPRS scale: 0/10 Pain location: L lower back Pain description: sharp Aggravating factors: walking,  sleeping on the L, increased activity. Relieving factors: cream, warm shower.  PRECAUTIONS: None  WEIGHT BEARING RESTRICTIONS: No  FALLS:  Has patient fallen in last 6 months? No  LIVING ENVIRONMENT: Lives with: lives alone Lives in: House/apartment Stairs: Yes, he has no difficulty managing steps Has following equipment at home: None  OCCUPATION: Retired  PLOF: Independent  PATIENT GOALS: Improve pain.  NEXT MD VISIT:   OBJECTIVE:   DIAGNOSTIC FINDINGS:  X-ray images lumbar spine obtained at primary care provider office on December 14 and provided by patient on a CD today personally and independently interpreted. Multilevel degenerative disc disease and mild facet DJD.  No acute fractures are visible   SCREENING FOR RED FLAGS: Bowel or bladder incontinence: No Spinal tumors: No Cauda equina syndrome: No Compression fracture: No Abdominal aneurysm: No  COGNITION: Overall cognitive status: Within functional limits for tasks assessed     SENSATION: Not tested  MUSCLE LENGTH: Hamstrings: Right 51 deg; Left 54 deg Thomas test: tight hip flexors B.  POSTURE: increased lumbar lordosis and anterior pelvic tilt  PALPATION: TTP lower thoracic and lumbar paraspinals B.  LUMBAR ROM: All movements painful.  AROM eval  Flexion Mid shin  Extension WFL  Right lateral flexion  Above knee  Left lateral flexion Above knee  Right rotation WFL  Left rotation WFL   (Blank rows = not tested)  LOWER EXTREMITY ROM:   generally WFL, stiff.  LOWER EXTREMITY MMT:  4-/5 throughout   LUMBAR SPECIAL TESTS:  Straight leg raise test: Negative and Slump test: Negative  FUNCTIONAL TESTS:  5 times sit to stand: 37.65  GAIT: Distance walked: Clinic distances Assistive device utilized: None Level of assistance: Complete Independence Comments: antalgic, slow  TODAY'S TREATMENT:                                                                                                                              DATE:  06/06/22 Bike L3 x 6 minutes. Supine stretching, SKTC, DKTC, LTR, glut stretch Trunk extension against black Tband, 2 x 10 Seated lat pulls, 15#, 2 x 10 reps Seated rows 15#, 2 x 10 reps   06/04/22 NuStep L3 x 6 minutes Supine LTR, 3 x 15 sec to each side. Lower R palpation, with mild TTP on R QL Supine bridge x 10 reps Supine isometric hip abd and add x 10 Seated rotation to R, slide hands out from trunk and return x 5, repeat to L Physiodisc lower body motion- Patient was fearful and was unable to isolate lower body, moved upper trunk. Seated rotation to R, forward and back weight shifts over hands, emphasizing lower trunk motion. 5 reps to each side. Trunk flexion in sit over physio ball, 5 reps forward, to R, to L.  05/30/22 Seated LE and hip stretching, HS, figure 4, leg cross, 2 x 30 sec each leg. Sit to stand with Gtband at knees, 4# ball, chest press at top, 10 reps  Seated trunk ext against black theraband 10 reps Seated rows, 15# 10 reps Lat pulls, 15#, 10 reps Pect stretch in doorway Step ups onto 6" step, 2 x 5 with each leg.  05/21/22 NuStep L5 x 6 minutes Supine BKTC over physioball LTR x 5 each way STM to L gluts followed by stretch to L glutes and piriformis Supine bridge over physioball x 10, repeat with hip IR Bridge and roll ball side  to side x 5 B side to side stepping with g tband resistance at knees. Sit <> stand on air ex with G tband at knees and perform OHP with 2# ball. 2 x 5 reps Heel raises on step x 10 reps, BUE support for balance  05/16/22 S2S OHP yellow ball x10 Rows and Ext green 2x10 each Supine bridges x10 Ab sets 2x10 with pball LE on pball bridges, K2C, Oblq MHP to Lumbar spine during all supine interventions  05/14/22 Bike L3 x 4:30 Seated HS stretch, 3 x 30 sec each Seated lumbar mobilizations in ant/post plane Seated rotational reach out to each side and back x 3 for stretch and strength. Supine glut stretch B, 2 x 30 seconds Supine clamshells against G tband x 10 Supine bridge with hip abd against G tband x 10 LTR x 5 to each side, hold x 10 SKTC 2 x 15 sec each leg STS x 5, no hands MH to low back x 10 minutes  05/10/22 Bike L1.5 x 5 min Bridges 2x10 Feet on pball rotations, bridges, knees to chest x10 Stretching hamstring, piriformis, single K2C, lower lumbar rotations MH to lumbar spine 77mins  05/04/22 NuStep L4 x33mins Stretching HS, SK2C 30s  Bridges 2x10 Feet on pball rotations, knees to chest x10 STS 2x10 MH to lumbar spine 69mins  05/02/22  Education Supine stretching-SKTC, DKTC, figure4, glut  PATIENT EDUCATION:  Education details: POC, HEP Person educated: Patient Education method: Consulting civil engineer, Media planner, and Handouts Education comprehension: verbalized understanding and returned demonstration  HOME EXERCISE PROGRAM:  [id number]  ASSESSMENT:  CLINICAL IMPRESSION: Pt arrived 15 min late today. He reports he is walking for exercise, he reports walking about 2 miles yesterday. Continued with warm up, stretching, and trunk stability activities. Tolerated all well. PN due on next visit.  OBJECTIVE IMPAIRMENTS: Abnormal gait, decreased activity tolerance, decreased coordination, decreased endurance, decreased mobility, difficulty walking, decreased ROM, decreased  strength, increased muscle spasms, impaired flexibility, and pain.   ACTIVITY LIMITATIONS: carrying, lifting, bending, standing, squatting, sleeping, and locomotion level  PARTICIPATION LIMITATIONS: meal prep, cleaning, laundry, shopping, and community activity  PERSONAL FACTORS: Age and Past/current experiences are also affecting patient's functional outcome.   REHAB POTENTIAL: Good  CLINICAL DECISION MAKING: Stable/uncomplicated  EVALUATION COMPLEXITY: Low   GOALS: Goals reviewed with patient? Yes  SHORT TERM GOALS: Target date: 05/20/22  I with initial HEP Baseline: Goal status: ongoing    LONG TERM GOALS: Target date: 07/25/22  I with final HEP Baseline:  Goal status: INITIAL  2.  Complete 5x STS in < 12 sec Baseline: 36 Goal status: ongoing  3.  Patient will report 75% decrease in back pain Baseline: 8/10 Goal status: ongoing  4.  Paitent will ambulate x at least 400' with back pain < 3/10 Baseline:  Goal status: ongoing  5.  Increase BLE strength to at least 4+/5 Baseline:  Goal status: ongoing  PLAN:  PT FREQUENCY: 2x/week  PT DURATION: 12 weeks  PLANNED INTERVENTIONS: Therapeutic exercises, Therapeutic activity, Neuromuscular re-education, Balance training, Gait training, Patient/Family education,  Self Care, Joint mobilization, Dry Needling, Electrical stimulation, Cryotherapy, Moist heat, Traction, Ionotophoresis 4mg /ml Dexamethasone, and Manual therapy.  PLAN FOR NEXT SESSION: acute back pain treatment, low back mobility, leg and back strengthening   DPT

## 2022-06-11 NOTE — Progress Notes (Unsigned)
   I, Peterson Lombard, LAT, ATC acting as a scribe for Lynne Leader, MD.  Paul Roberts is a 77 y.o. male who presents to [place] at [facility] today for 6-wk f/u LBP. Pt was last seen by Dr. Georgina Snell on 05/01/22 and was encouraged to take the tramadol prescribed by his PCP and was referred to PT, completing 9 visits. Today, pt reports  Dx imaging: 04/15/2018 L-spine XR   Pertinent review of systems: ***  Relevant historical information: ***   Exam:  There were no vitals taken for this visit. General: Well Developed, well nourished, and in no acute distress.   MSK: ***    Lab and Radiology Results No results found for this or any previous visit (from the past 72 hour(s)). No results found.     Assessment and Plan: 77 y.o. male with ***   PDMP not reviewed this encounter. No orders of the defined types were placed in this encounter.  No orders of the defined types were placed in this encounter.    Discussed warning signs or symptoms. Please see discharge instructions. Patient expresses understanding.   ***

## 2022-06-12 ENCOUNTER — Ambulatory Visit (INDEPENDENT_AMBULATORY_CARE_PROVIDER_SITE_OTHER): Payer: Medicare Other | Admitting: Family Medicine

## 2022-06-12 VITALS — BP 132/70 | HR 73 | Ht 67.0 in | Wt 176.0 lb

## 2022-06-12 DIAGNOSIS — M545 Low back pain, unspecified: Secondary | ICD-10-CM | POA: Diagnosis not present

## 2022-06-12 DIAGNOSIS — G8929 Other chronic pain: Secondary | ICD-10-CM

## 2022-06-12 NOTE — Patient Instructions (Signed)
Thank you for coming in today.   Continue home exercises.   Recheck as needed.  

## 2022-07-03 DIAGNOSIS — E118 Type 2 diabetes mellitus with unspecified complications: Secondary | ICD-10-CM | POA: Diagnosis not present

## 2022-07-03 DIAGNOSIS — E1165 Type 2 diabetes mellitus with hyperglycemia: Secondary | ICD-10-CM | POA: Diagnosis not present

## 2022-07-04 ENCOUNTER — Inpatient Hospital Stay: Payer: Medicare Other | Attending: Hematology and Oncology

## 2022-07-04 ENCOUNTER — Inpatient Hospital Stay (HOSPITAL_BASED_OUTPATIENT_CLINIC_OR_DEPARTMENT_OTHER): Payer: Medicare Other | Admitting: Hematology and Oncology

## 2022-07-04 ENCOUNTER — Other Ambulatory Visit: Payer: Self-pay | Admitting: Hematology and Oncology

## 2022-07-04 ENCOUNTER — Other Ambulatory Visit: Payer: Self-pay

## 2022-07-04 VITALS — BP 125/70 | HR 81 | Temp 98.1°F | Resp 18 | Wt 174.4 lb

## 2022-07-04 DIAGNOSIS — Z86711 Personal history of pulmonary embolism: Secondary | ICD-10-CM | POA: Insufficient documentation

## 2022-07-04 DIAGNOSIS — I2694 Multiple subsegmental pulmonary emboli without acute cor pulmonale: Secondary | ICD-10-CM

## 2022-07-04 DIAGNOSIS — Z7901 Long term (current) use of anticoagulants: Secondary | ICD-10-CM | POA: Insufficient documentation

## 2022-07-04 DIAGNOSIS — Z87891 Personal history of nicotine dependence: Secondary | ICD-10-CM | POA: Diagnosis not present

## 2022-07-04 LAB — CBC WITH DIFFERENTIAL (CANCER CENTER ONLY)
Abs Immature Granulocytes: 0.02 10*3/uL (ref 0.00–0.07)
Basophils Absolute: 0.1 10*3/uL (ref 0.0–0.1)
Basophils Relative: 1 %
Eosinophils Absolute: 0.2 10*3/uL (ref 0.0–0.5)
Eosinophils Relative: 3 %
HCT: 39.7 % (ref 39.0–52.0)
Hemoglobin: 13.3 g/dL (ref 13.0–17.0)
Immature Granulocytes: 0 %
Lymphocytes Relative: 32 %
Lymphs Abs: 2.1 10*3/uL (ref 0.7–4.0)
MCH: 27.4 pg (ref 26.0–34.0)
MCHC: 33.5 g/dL (ref 30.0–36.0)
MCV: 81.7 fL (ref 80.0–100.0)
Monocytes Absolute: 0.6 10*3/uL (ref 0.1–1.0)
Monocytes Relative: 9 %
Neutro Abs: 3.7 10*3/uL (ref 1.7–7.7)
Neutrophils Relative %: 55 %
Platelet Count: 234 10*3/uL (ref 150–400)
RBC: 4.86 MIL/uL (ref 4.22–5.81)
RDW: 13.7 % (ref 11.5–15.5)
WBC Count: 6.7 10*3/uL (ref 4.0–10.5)
nRBC: 0 % (ref 0.0–0.2)

## 2022-07-04 LAB — CMP (CANCER CENTER ONLY)
ALT: 12 U/L (ref 0–44)
AST: 15 U/L (ref 15–41)
Albumin: 4.2 g/dL (ref 3.5–5.0)
Alkaline Phosphatase: 44 U/L (ref 38–126)
Anion gap: 10 (ref 5–15)
BUN: 13 mg/dL (ref 8–23)
CO2: 27 mmol/L (ref 22–32)
Calcium: 8.8 mg/dL — ABNORMAL LOW (ref 8.9–10.3)
Chloride: 102 mmol/L (ref 98–111)
Creatinine: 1.19 mg/dL (ref 0.61–1.24)
GFR, Estimated: >60 mL/min (ref >60)
Glucose, Bld: 211 mg/dL — ABNORMAL HIGH (ref 70–99)
Potassium: 3.3 mmol/L — ABNORMAL LOW (ref 3.5–5.1)
Sodium: 139 mmol/L (ref 135–145)
Total Bilirubin: 0.5 mg/dL (ref 0.3–1.2)
Total Protein: 6.8 g/dL (ref 6.5–8.1)

## 2022-07-04 NOTE — Progress Notes (Signed)
[place] Telephone:([phone]   [phone]  PROGRESS NOTE  Patient Care Team: Deland Pretty, MD as PCP - General (Internal Medicine)  Hematological/Oncological History # Unprovoked Bilateral Pulmonary Emboli 04/18/2021: CT Angio chest showed bilateral segmental pulmonary emboli with CT evidence of right heart strain (RV/LV Ratio = 1.2) consistent with at least submassive (intermediate risk) PE.  Initially started on heparin therapy but transitioned to Eliquis at time of discharge 07/05/2021: Establish care with Dr. Lorenso Roberts 01/03/2022: Transition to maintenance dose 2.5 mg twice daily Eliquis  Interval History:  Paul Roberts 77 y.o. male with medical history significant for unprovoked bilateral pulmonary emboli who presents for a follow up visit. The patient's last visit was on 01/03/2022. In the interim since the last visit he has continued on eliquis therapy.   On exam Dr. [name] reports he has been okay in the interim since her last visit 6 months ago.  He thinks that the Eliquis may be causing him difficulty "physically and emotionally".  He is not having any bleeding, bruising, or dark stools.  He does have some occasional bouts of headaches.  He takes Tylenol for this but is unsure if it helps.  He reports that he is concerned that the blood thinner may be "harming his circulation".  He notes that he also has some occasional issues with his memory.  Overall his health is stable.  After discussion about the risks and benefits of Eliquis therapy and the likelihood that his symptoms were not currently related to it he was willing and able to proceed with the Eliquis therapy at this time.  He reports no fevers, chills, sweats, nausea, vomiting or diarrhea.  A full 10 point ROS is listed below.  MEDICAL HISTORY:  Past Medical History:  Diagnosis Date   Diabetes mellitus without complication ([place])    Hypertension     SURGICAL HISTORY: Past Surgical History:   Procedure Laterality Date   NO PAST SURGERIES      SOCIAL HISTORY: Social History   Socioeconomic History   Marital status: Widowed    Spouse name: Not on file   Number of children: Not on file   Years of education: Not on file   Highest education level: Not on file  Occupational History   Not on file  Tobacco Use   Smoking status: Former   Smokeless tobacco: Never  Vaping Use   Vaping Use: Never used  Substance and Sexual Activity   Alcohol use: No   Drug use: No   Sexual activity: Not on file  Other Topics Concern   Not on file  Social History Narrative   Right handed   Social Determinants of Health   Financial Resource Strain: Not on file  Food Insecurity: Not on file  Transportation Needs: Not on file  Physical Activity: Not on file  Stress: Not on file  Social Connections: Not on file  Intimate Partner Violence: Not on file    FAMILY HISTORY: No family history on file.  ALLERGIES:  is allergic to ciprofloxacin, rosuvastatin, and sulfa antibiotics.  MEDICATIONS:  Current Outpatient Medications  Medication Sig Dispense Refill   amLODipine (NORVASC) 5 MG tablet Take 5 mg by mouth daily.     apixaban (ELIQUIS) 2.5 MG TABS tablet Take 1 tablet (2.5 mg total) by mouth 2 (two) times daily. 180 tablet 3   atorvastatin (LIPITOR) 20 MG tablet Take 20 mg by mouth daily.     glimepiride (AMARYL) 1 MG tablet Take 1  mg by mouth daily with breakfast.     Glycerin-Hypromellose-PEG 400 (DRY EYE RELIEF DROPS OP) Apply 1-2 drops to eye as needed (dryness).     metFORMIN (GLUCOPHAGE-XR) 500 MG 24 hr tablet Take 500 mg by mouth 2 (two) times daily.  2   Multiple Vitamin (MULTI-VITAMIN) tablet Take 1 tablet by mouth daily.     NOVOLIN 70/30 KWIKPEN (70-30) 100 UNIT/ML KwikPen Inject 8 Units into the skin 2 (two) times daily.     primidone (MYSOLINE) 50 MG tablet Take 100 mg by mouth at bedtime.     propranolol (INDERAL) 10 MG tablet Take 10 mg by mouth 2 (two) times daily.      tamsulosin (FLOMAX) 0.4 MG CAPS capsule Take 0.8 mg by mouth daily.     traMADol (ULTRAM) 50 MG tablet Take by mouth every 6 (six) hours as needed.     valsartan-hydrochlorothiazide (DIOVAN-HCT) 160-25 MG per tablet Take 1 tablet by mouth at bedtime.      No current facility-administered medications for this visit.    REVIEW OF SYSTEMS:   Constitutional: ( - ) fevers, ( - )  chills , ( - ) night sweats Eyes: ( - ) blurriness of vision, ( - ) double vision, ( - ) watery eyes Ears, nose, mouth, throat, and face: ( - ) mucositis, ( - ) sore throat Respiratory: ( - ) cough, ( - ) dyspnea, ( - ) wheezes Cardiovascular: ( - ) palpitation, ( - ) chest discomfort, ( - ) lower extremity swelling Gastrointestinal:  ( - ) nausea, ( - ) heartburn, ( - ) change in bowel habits Skin: ( - ) abnormal skin rashes Lymphatics: ( - ) new lymphadenopathy, ( - ) easy bruising Neurological: ( - ) numbness, ( - ) tingling, ( - ) new weaknesses Behavioral/Psych: ( - ) mood change, ( - ) new changes  All other systems were reviewed with the patient and are negative.  PHYSICAL EXAMINATION:  Vitals:   07/04/22 1139  BP: 125/70  Pulse: 81  Resp: 18  Temp: 98.1 F (36.7 C)  SpO2: 100%   Filed Weights   07/04/22 1139  Weight: 174 lb 6 oz (79.1 kg)    GENERAL: well appearing elderly male, alert, no distress and comfortable SKIN: skin color, texture, turgor are normal, no rashes or significant lesions EYES: conjunctiva are pink and non-injected, sclera clear LUNGS: clear to auscultation and percussion with normal breathing effort HEART: regular rate & rhythm and no murmurs and no lower extremity edema Musculoskeletal: no cyanosis of digits and no clubbing  PSYCH: alert & oriented x 3, fluent speech NEURO: no focal motor/sensory deficits  LABORATORY DATA:  I have reviewed the data as listed    Latest Ref Rng & Units 07/04/2022   10:58 AM 01/03/2022   10:04 AM 10/04/2021   11:09 AM  CBC  WBC 4.0 -  10.5 K/uL 6.7  5.8  5.2   Hemoglobin 13.0 - 17.0 g/dL 13.3  12.5  13.2   Hematocrit 39.0 - 52.0 % 39.7  36.5  39.3   Platelets 150 - 400 K/uL 234  201  190        Latest Ref Rng & Units 07/04/2022   10:58 AM 01/03/2022   10:04 AM 10/04/2021   11:09 AM  CMP  Glucose 70 - 99 mg/dL 211  275  494   BUN 8 - 23 mg/dL '13  12  16   '$ Creatinine 0.61 - 1.24 mg/dL 1.19  1.15  1.19   Sodium 135 - 145 mmol/L 139  138  135   Potassium 3.5 - 5.1 mmol/L 3.3  3.3  3.5   Chloride 98 - 111 mmol/L 102  102  99   CO2 22 - 32 mmol/L '27  29  26   '$ Calcium 8.9 - 10.3 mg/dL 8.8  9.6  9.3   Total Protein 6.5 - 8.1 g/dL 6.8  7.1  7.3   Total Bilirubin 0.3 - 1.2 mg/dL 0.5  0.5  0.5   Alkaline Phos 38 - 126 U/L 44  46  47   AST 15 - 41 U/L '15  13  13   '$ ALT 0 - 44 U/L '12  13  14     '$ RADIOGRAPHIC STUDIES: No results found.  ASSESSMENT & PLAN Paul Roberts 77 y.o. male with medical history significant for unprovoked bilateral pulmonary emboli who presents for a follow up visit.   A provoked venous thromboembolism (VTE) is one that has a clear inciting factor or event. Provoking factors include prolonged travel/immobility, surgery (particular abdominal or orthropedic), trauma,  and pregnancy/ estrogen containing birth control. After a detailed history and review of the records there is no clear provoking factor for this patient's VTE.  Patients with unprovoked VTEs have up to 25% recurrence after 5 years and 36% at 10 years, with 4% of these clots being fatal (BMJ?2019;366:l4363). Therefore the formal recommendation for unprovoked VTE's is lifelong anticoagulation, as the cause may not be transient or reversible. We recommend 6 months or full strength anticoagulation with a re-evaluation after that time.  The patient's will then have a choice of maintenance dose DOAC (preferred, recommended), '81mg'$  ASA PO daily (non-preferred), or no further anticoagulation (not recommended).    #Bilateral Unprovoked Pulmonary  Emboli --findings at this time are consistent with a unprovoked VTE  --will order baseline CMP and CBC to assure labs are adequate for DOAC therapy  --ruled out APS with anticardiolipin and anti beta2 glycoprotein antibodies.  Lupus anticoagulant panel would be altered by presence of blood thinner, will hold on this testing.    --patient denies any bleeding, bruising, or dark stools on this medication. It is well tolerated. No difficulties accessing/affording the medication  Plan:  --Labs today show white blood cell count 6.7, hemoglobin 13.3, MCV 81.7, and platelets of 234 --recommend the patient continue eliquis 2.5 mg twice daily. --Today he demonstrates no signs or symptoms concerning for recurrent VTE such as chest pain, shortness of breath, leg swelling, or leg pain. --RTC in 6 months' time with strict return precautions for overt signs of bleeding.    # Hyperglycemia -- Improved from prior, glucose is 211 today. -- Continue to follow with primary care provider regarding sugar control.  No orders of the defined types were placed in this encounter.   All questions were answered. The patient knows to call the clinic with any problems, questions or concerns.  A total of more than 25 minutes were spent on this encounter with face-to-face time and non-face-to-face time, including preparing to see the patient, ordering tests and/or medications, counseling the patient and coordination of care as outlined above.   Ledell Peoples, MD Department of Hematology/Oncology [place] at [facility] Phone: [phone] Pager: [phone] Email: [email or web address]  07/08/2022 6:54 PM

## 2022-08-03 ENCOUNTER — Other Ambulatory Visit: Payer: Self-pay

## 2022-08-04 DIAGNOSIS — Z1212 Encounter for screening for malignant neoplasm of rectum: Secondary | ICD-10-CM | POA: Diagnosis not present

## 2022-08-04 DIAGNOSIS — Z1211 Encounter for screening for malignant neoplasm of colon: Secondary | ICD-10-CM | POA: Diagnosis not present

## 2022-08-11 LAB — COLOGUARD: COLOGUARD: NEGATIVE

## 2022-09-01 NOTE — Progress Notes (Unsigned)
Assessment/Plan:   Tremor  -Likely severe essential tremor.  Patient does have a rest component that is very intermittent on the left, but suspect that this is due to the severity of his essential tremor and longstanding nature.  -Does not want DaTscan, but again he meets no other criteria for Parkinson's disease  -discussed with him that we likely won't get control of tremor without surgical interventions.  He was shown HIPAA compliant videos of patients who have had surgical intervention.  We discussed DBS and focused ultrasound separately and the benefits of both (benefit of DBS -better tremor control and likely lasts longer; benefit of focused ultrasound -he is on Eliquis and would require going off it less).  Ultimately, patient states that he wants no surgical interventions.  He states that"I let my God and nature" take care of it.  --cannot be on primidone due to interaction with eliquis.  Even if he could, I told him it is unlikely that it would make a significant difference in his degree of tremor.  -Patient's last MRI brain and prior CTs brain demonstrate ventriculomegaly, out of proportion to atrophy.  Patient, however, without parkinsonism (with the exception of the left rest tremor).  His gait is very good and he has no features of NPH.  Given stability of ventriculomegaly, we decided to hold on reimaging, but we should consider this should new symptoms arise.  There would be no benefit of doing an LP at this point in time given that there would be nothing to test.  Subjective:   Paul Roberts was seen today in the movement disorders clinic for neurologic consultation at the request of Dorisann Frames, MD.  The consultation is for the evaluation of tremor.  He has seen [facility] and [facility] for the same.  Outside records that were made available to me were reviewed.  He started seeing Dr. Vickey Huger in December, 2020.  Tremor started in his left hand 9 months prior to her visit with him,  but then began to involve the right hand.  She noted that patient had some parkinsonism and she started the patient on levodopa.  It sounds like the patient had some visual distortions after starting this medication and he ended up stopping it and those resolved.  Patient transferred care to Dr. Kate Sable in May, 2022.  He was diagnosed with essential tremor and started on propranolol 10 mg twice per day.  He isn't sure he took it.  He followed up 3 months later in August, 2022 and was started on primidone 50 mg, 2 tablets at bedtime.  Pt states that "I didn't take the medications."  He reports that he thinks that the medication "wasn't approved" and was a bit expensive.    Pt states that hand tremor started not long after wife died and he was "deeply depressed."  He states that tremor comes and goes, but "sometimes when I hold cups or water" he may have to hold things with both hands.  He has trouble with writing.  He reports that now, the R hand has more tremor than the L.  He is R hand dominant.  No fam hx of tremor.     Other Specific Symptoms:  Voice: no change and no vocal tremor Postural symptoms:  some off balance and unsure why  Falls?  No. Bradykinesia symptoms: no bradykinesia noted (walks 1.5-2 hours/day for exercise daily so that "I can prevent more blood clots" Difficulty Swallowing:  No. Handwriting, micrographia: No., but  its not legible   Neuroimaging of the brain was last done in 2018 at [facility].  I personally reviewed this.  I believe that ventricles are out of proportion to degree of atrophy, but this was also true on his CT brain in 2014 and 2018.  I reviewed all of these images.  PREVIOUS MEDICATIONS: propranolol, 10 mg bid; primidone, 100 mg q hs (before the eliquis)  ALLERGIES:   Allergies  Allergen Reactions   Ciprofloxacin Other (See Comments)   Rosuvastatin Nausea And Vomiting and Other (See Comments)   Sulfa Antibiotics Other (See Comments)    Pt denies     CURRENT MEDICATIONS:  Current Outpatient Medications  Medication Instructions   amLODipine (NORVASC) 5 mg, Oral, Daily   apixaban (ELIQUIS) 2.5 mg, Oral, 2 times daily   atorvastatin (LIPITOR) 20 mg, Oral, Daily   glimepiride (AMARYL) 1 mg, Oral, Daily with breakfast   Glycerin-Hypromellose-PEG 400 (DRY EYE RELIEF DROPS OP) 1-2 drops, Ophthalmic, As needed   metFORMIN (GLUCOPHAGE-XR) 500 mg, Oral, 2 times daily   Multiple Vitamin (MULTI-VITAMIN) tablet 1 tablet, Oral, Daily   NOVOLIN 70/30 KWIKPEN (70-30) 100 UNIT/ML KwikPen 8 Units, Subcutaneous, 2 times daily   tamsulosin (FLOMAX) 0.8 mg, Oral, Daily   valsartan-hydrochlorothiazide (DIOVAN-HCT) 160-25 MG per tablet 1 tablet, Oral, Daily at bedtime    Objective:   PHYSICAL EXAMINATION:    VITALS:   Vitals:   09/03/22 1238  BP: 124/74  Pulse: 62  SpO2: 97%  Weight: 176 lb 6.4 oz (80 kg)  Height: 5\' 11"  (1.803 m)    GEN:  The patient appears stated age and is in NAD. HEENT:  Normocephalic, atraumatic.  The mucous membranes are moist. The superficial temporal arteries are without ropiness or tenderness. CV:  RRR Lungs:  CTAB Neck/HEME:  There are no carotid bruits bilaterally.  Neurological examination:  Orientation: The patient is alert and oriented x3.  Cranial nerves: There is good facial symmetry.  He tracks appropriately about the room, but has trouble with formal extraocular muscle testing, moving his head each time.  The visual fields are full to confrontational testing. The speech is fluent and clear. Soft palate rises symmetrically and there is no tongue deviation. Hearing is intact to conversational tone. Sensation: Sensation is intact to light touch throughout (facial, trunk, extremities). Vibration is intact at the bilateral big toe. There is no extinction with double simultaneous stimulation.  Motor: Strength is 5/5 in the bilateral upper and lower extremities.   Shoulder shrug is equal and symmetric.  There  is no pronator drift. Deep tendon reflexes: Deep tendon reflexes are 1/4 at the bilateral biceps, triceps, brachioradialis, patella and achilles. Plantar responses are downgoing bilaterally.  Movement examination: Tone: There is normal tone in the bilateral upper extremities.  The tone in the lower extremities is normal.  Abnormal movements: Rare and intermittent left upper extremity rest tremor.  There is postural tremor bilaterally.  He has significant trouble with Archimedes spirals, left more than right.  He spills water when he pours it from 1 glass to another. Coordination:  There is no decremation with RAM's, with any form of RAMS, including alternating supination and pronation of the forearm, hand opening and closing, finger taps, heel taps and toe taps.  He is slow and purposeful though, with all of these movements. Gait and Station: The patient has no difficulty arising out of a deep-seated chair without the use of the hands. The patient's stride length is good.   I have  reviewed and interpreted the following labs independently   Chemistry      Component Value Date/Time   NA 139 07/04/2022 1058   K 3.3 (L) 07/04/2022 1058   CL 102 07/04/2022 1058   CO2 27 07/04/2022 1058   BUN 13 07/04/2022 1058   CREATININE 1.19 07/04/2022 1058      Component Value Date/Time   CALCIUM 8.8 (L) 07/04/2022 1058   ALKPHOS 44 07/04/2022 1058   AST 15 07/04/2022 1058   ALT 12 07/04/2022 1058   BILITOT 0.5 07/04/2022 1058      No results found for: "TSH" Lab Results  Component Value Date   WBC 6.7 07/04/2022   HGB 13.3 07/04/2022   HCT 39.7 07/04/2022   MCV 81.7 07/04/2022   PLT 234 07/04/2022      Total time spent on today's visit was 60 minutes, including both face-to-face time and nonface-to-face time.  Time included that spent on review of records (prior notes available to me/labs/imaging if pertinent), discussing treatment and goals, answering patient's questions and coordinating  care.  Cc:  Merri Brunette, MD

## 2022-09-03 ENCOUNTER — Encounter: Payer: Self-pay | Admitting: Neurology

## 2022-09-03 ENCOUNTER — Ambulatory Visit: Payer: Medicare Other | Admitting: Neurology

## 2022-09-03 VITALS — BP 124/74 | HR 62 | Ht 71.0 in | Wt 176.4 lb

## 2022-09-03 DIAGNOSIS — R9402 Abnormal brain scan: Secondary | ICD-10-CM | POA: Diagnosis not present

## 2022-09-03 DIAGNOSIS — G250 Essential tremor: Secondary | ICD-10-CM | POA: Diagnosis not present

## 2022-09-03 NOTE — Patient Instructions (Signed)
We discussed focused ultrasound and deep brain stimulation surgeries but I know you want to defer.  We discussed that primidone interferes with the eliquis, but your tremor is so severe that it likely won't respond to oral medications.  The physicians and staff at [facility] are committed to providing excellent care. You may receive a survey requesting feedback about your experience at our office. We strive to receive "very good" responses to the survey questions. If you feel that your experience would prevent you from giving the office a "very good " response, please contact our office to try to remedy the situation. We may be reached at [phone]. Thank you for taking the time out of your busy day to complete the survey.

## 2022-11-29 DIAGNOSIS — E039 Hypothyroidism, unspecified: Secondary | ICD-10-CM | POA: Diagnosis not present

## 2022-11-29 DIAGNOSIS — E1165 Type 2 diabetes mellitus with hyperglycemia: Secondary | ICD-10-CM | POA: Diagnosis not present

## 2022-11-29 DIAGNOSIS — Z125 Encounter for screening for malignant neoplasm of prostate: Secondary | ICD-10-CM | POA: Diagnosis not present

## 2022-12-04 DIAGNOSIS — Z0000 Encounter for general adult medical examination without abnormal findings: Secondary | ICD-10-CM | POA: Diagnosis not present

## 2022-12-04 DIAGNOSIS — E118 Type 2 diabetes mellitus with unspecified complications: Secondary | ICD-10-CM | POA: Diagnosis not present

## 2022-12-04 DIAGNOSIS — I10 Essential (primary) hypertension: Secondary | ICD-10-CM | POA: Diagnosis not present

## 2022-12-04 DIAGNOSIS — I2510 Atherosclerotic heart disease of native coronary artery without angina pectoris: Secondary | ICD-10-CM | POA: Diagnosis not present

## 2022-12-04 DIAGNOSIS — Z86711 Personal history of pulmonary embolism: Secondary | ICD-10-CM | POA: Diagnosis not present

## 2022-12-05 ENCOUNTER — Encounter: Payer: Self-pay | Admitting: Internal Medicine

## 2022-12-08 ENCOUNTER — Observation Stay (HOSPITAL_COMMUNITY)
Admission: EM | Admit: 2022-12-08 | Payer: Medicare Other | Source: Home / Self Care | Attending: Emergency Medicine | Admitting: Emergency Medicine

## 2022-12-08 ENCOUNTER — Emergency Department (HOSPITAL_COMMUNITY): Payer: Medicare Other

## 2022-12-08 ENCOUNTER — Other Ambulatory Visit: Payer: Self-pay

## 2022-12-08 DIAGNOSIS — J45909 Unspecified asthma, uncomplicated: Secondary | ICD-10-CM | POA: Insufficient documentation

## 2022-12-08 DIAGNOSIS — Z7901 Long term (current) use of anticoagulants: Secondary | ICD-10-CM | POA: Diagnosis not present

## 2022-12-08 DIAGNOSIS — R059 Cough, unspecified: Secondary | ICD-10-CM | POA: Diagnosis not present

## 2022-12-08 DIAGNOSIS — Z86711 Personal history of pulmonary embolism: Secondary | ICD-10-CM | POA: Insufficient documentation

## 2022-12-08 DIAGNOSIS — G250 Essential tremor: Secondary | ICD-10-CM

## 2022-12-08 DIAGNOSIS — E0829 Diabetes mellitus due to underlying condition with other diabetic kidney complication: Secondary | ICD-10-CM

## 2022-12-08 DIAGNOSIS — E872 Acidosis, unspecified: Principal | ICD-10-CM | POA: Diagnosis present

## 2022-12-08 DIAGNOSIS — Z7984 Long term (current) use of oral hypoglycemic drugs: Secondary | ICD-10-CM | POA: Insufficient documentation

## 2022-12-08 DIAGNOSIS — Z794 Long term (current) use of insulin: Secondary | ICD-10-CM

## 2022-12-08 DIAGNOSIS — U071 COVID-19: Secondary | ICD-10-CM | POA: Insufficient documentation

## 2022-12-08 DIAGNOSIS — Z87891 Personal history of nicotine dependence: Secondary | ICD-10-CM | POA: Insufficient documentation

## 2022-12-08 DIAGNOSIS — E119 Type 2 diabetes mellitus without complications: Secondary | ICD-10-CM | POA: Diagnosis not present

## 2022-12-08 DIAGNOSIS — I10 Essential (primary) hypertension: Secondary | ICD-10-CM | POA: Diagnosis not present

## 2022-12-08 DIAGNOSIS — E039 Hypothyroidism, unspecified: Secondary | ICD-10-CM | POA: Insufficient documentation

## 2022-12-08 DIAGNOSIS — E1110 Type 2 diabetes mellitus with ketoacidosis without coma: Secondary | ICD-10-CM | POA: Diagnosis not present

## 2022-12-08 DIAGNOSIS — Z79899 Other long term (current) drug therapy: Secondary | ICD-10-CM | POA: Insufficient documentation

## 2022-12-08 DIAGNOSIS — E785 Hyperlipidemia, unspecified: Secondary | ICD-10-CM

## 2022-12-08 DIAGNOSIS — R918 Other nonspecific abnormal finding of lung field: Secondary | ICD-10-CM | POA: Diagnosis not present

## 2022-12-08 LAB — CBC WITH DIFFERENTIAL/PLATELET
Abs Immature Granulocytes: 0.01 10*3/uL (ref 0.00–0.07)
Basophils Absolute: 0 10*3/uL (ref 0.0–0.1)
Basophils Relative: 1 %
Eosinophils Absolute: 0.1 10*3/uL (ref 0.0–0.5)
Eosinophils Relative: 2 %
HCT: 37 % — ABNORMAL LOW (ref 39.0–52.0)
Hemoglobin: 12.3 g/dL — ABNORMAL LOW (ref 13.0–17.0)
Immature Granulocytes: 0 %
Lymphocytes Relative: 31 %
Lymphs Abs: 1.4 10*3/uL (ref 0.7–4.0)
MCH: 28.1 pg (ref 26.0–34.0)
MCHC: 33.2 g/dL (ref 30.0–36.0)
MCV: 84.5 fL (ref 80.0–100.0)
Monocytes Absolute: 0.6 10*3/uL (ref 0.1–1.0)
Monocytes Relative: 14 %
Neutro Abs: 2.4 10*3/uL (ref 1.7–7.7)
Neutrophils Relative %: 52 %
Platelets: 169 10*3/uL (ref 150–400)
RBC: 4.38 MIL/uL (ref 4.22–5.81)
RDW: 13.7 % (ref 11.5–15.5)
WBC: 4.5 10*3/uL (ref 4.0–10.5)
nRBC: 0 % (ref 0.0–0.2)

## 2022-12-08 LAB — CBG MONITORING, ED: Glucose-Capillary: 289 mg/dL — ABNORMAL HIGH (ref 70–99)

## 2022-12-08 LAB — I-STAT VENOUS BLOOD GAS, ED
Acid-Base Excess: 2 mmol/L (ref 0.0–2.0)
Bicarbonate: 25.4 mmol/L (ref 20.0–28.0)
Calcium, Ion: 0.98 mmol/L — ABNORMAL LOW (ref 1.15–1.40)
HCT: 31 % — ABNORMAL LOW (ref 39.0–52.0)
Hemoglobin: 10.5 g/dL — ABNORMAL LOW (ref 13.0–17.0)
O2 Saturation: 96 %
Potassium: 3.9 mmol/L (ref 3.5–5.1)
Sodium: 135 mmol/L (ref 135–145)
TCO2: 26 mmol/L (ref 22–32)
pCO2, Ven: 33.8 mmHg — ABNORMAL LOW (ref 44–60)
pH, Ven: 7.484 — ABNORMAL HIGH (ref 7.25–7.43)
pO2, Ven: 74 mmHg — ABNORMAL HIGH (ref 32–45)

## 2022-12-08 LAB — URINALYSIS, ROUTINE W REFLEX MICROSCOPIC
Bacteria, UA: NONE SEEN
Bilirubin Urine: NEGATIVE
Glucose, UA: >=500 mg/dL — AB
Hgb urine dipstick: NEGATIVE
Ketones, ur: NEGATIVE mg/dL
Leukocytes,Ua: NEGATIVE
Nitrite: NEGATIVE
Protein, ur: NEGATIVE mg/dL
Specific Gravity, Urine: 1.03 (ref 1.005–1.030)
pH: 5 (ref 5.0–8.0)

## 2022-12-08 LAB — RESP PANEL BY RT-PCR (RSV, FLU A&B, COVID)  RVPGX2
Influenza A by PCR: NEGATIVE
Influenza B by PCR: NEGATIVE
Resp Syncytial Virus by PCR: NEGATIVE
SARS Coronavirus 2 by RT PCR: POSITIVE — AB

## 2022-12-08 LAB — I-STAT CG4 LACTIC ACID, ED
Lactic Acid, Venous: 3.6 mmol/L (ref 0.5–1.9)
Lactic Acid, Venous: 5.5 mmol/L (ref 0.5–1.9)

## 2022-12-08 LAB — COMPREHENSIVE METABOLIC PANEL
ALT: 31 U/L (ref 0–44)
AST: 31 U/L (ref 15–41)
Albumin: 3.6 g/dL (ref 3.5–5.0)
Alkaline Phosphatase: 49 U/L (ref 38–126)
Anion gap: 13 (ref 5–15)
BUN: 15 mg/dL (ref 8–23)
CO2: 24 mmol/L (ref 22–32)
Calcium: 8.6 mg/dL — ABNORMAL LOW (ref 8.9–10.3)
Chloride: 95 mmol/L — ABNORMAL LOW (ref 98–111)
Creatinine, Ser: 1.39 mg/dL — ABNORMAL HIGH (ref 0.61–1.24)
GFR, Estimated: 53 mL/min — ABNORMAL LOW (ref >60)
Glucose, Bld: 447 mg/dL — ABNORMAL HIGH (ref 70–99)
Potassium: 3.1 mmol/L — ABNORMAL LOW (ref 3.5–5.1)
Sodium: 132 mmol/L — ABNORMAL LOW (ref 135–145)
Total Bilirubin: 0.7 mg/dL (ref 0.3–1.2)
Total Protein: 7 g/dL (ref 6.5–8.1)

## 2022-12-08 LAB — LACTIC ACID, PLASMA: Lactic Acid, Venous: 2.4 mmol/L (ref 0.5–1.9)

## 2022-12-08 LAB — TROPONIN I (HIGH SENSITIVITY): Troponin I (High Sensitivity): 16 ng/L (ref <18)

## 2022-12-08 LAB — D-DIMER, QUANTITATIVE: D-Dimer, Quant: <0.27 ug/mL-FEU (ref 0.00–0.50)

## 2022-12-08 LAB — GLUCOSE, CAPILLARY: Glucose-Capillary: 293 mg/dL — ABNORMAL HIGH (ref 70–99)

## 2022-12-08 MED ORDER — ACETAMINOPHEN 650 MG RE SUPP: 650.0000 mg | Freq: Four times a day (QID) | RECTAL | Status: DC | PRN

## 2022-12-08 MED ORDER — POLYETHYLENE GLYCOL 3350 17 G PO PACK: 17.0000 g | PACK | Freq: Every day | ORAL | Status: DC | PRN

## 2022-12-08 MED ORDER — SODIUM CHLORIDE 0.9% FLUSH
3.0000 mL | Freq: Two times a day (BID) | INTRAVENOUS | Status: DC
  Administered 2022-12-08: 3 mL via INTRAVENOUS

## 2022-12-08 MED ORDER — SODIUM CHLORIDE 0.9 % IV SOLN: INTRAVENOUS | Status: AC

## 2022-12-08 MED ORDER — APIXABAN 2.5 MG PO TABS
2.5000 mg | ORAL_TABLET | Freq: Two times a day (BID) | ORAL | Status: DC
  Filled 2022-12-08: qty 1

## 2022-12-08 MED ORDER — LACTATED RINGERS IV BOLUS
2000.0000 mL | Freq: Once | INTRAVENOUS | Status: AC
  Administered 2022-12-08: 2000 mL via INTRAVENOUS

## 2022-12-08 MED ORDER — GLYCERIN-HYPROMELLOSE-PEG 400 0.2-0.2-1 % OP SOLN: 1.0000 [drp] | OPHTHALMIC | Status: DC | PRN

## 2022-12-08 MED ORDER — INSULIN ASPART 100 UNIT/ML IJ SOLN: 0.0000 [IU] | Freq: Three times a day (TID) | INTRAMUSCULAR | Status: DC

## 2022-12-08 MED ORDER — INSULIN ASPART 100 UNIT/ML IJ SOLN: 0.0000 [IU] | Freq: Every day | INTRAMUSCULAR | Status: DC

## 2022-12-08 MED ORDER — POLYVINYL ALCOHOL 1.4 % OP SOLN
1.0000 [drp] | OPHTHALMIC | Status: DC | PRN
  Filled 2022-12-08: qty 15

## 2022-12-08 MED ORDER — ACETAMINOPHEN 325 MG PO TABS: 650.0000 mg | ORAL_TABLET | Freq: Four times a day (QID) | ORAL | Status: DC | PRN

## 2022-12-08 MED ORDER — TAMSULOSIN HCL 0.4 MG PO CAPS: 0.8000 mg | ORAL_CAPSULE | Freq: Every day | ORAL | Status: DC

## 2022-12-08 NOTE — Progress Notes (Signed)
Patient arrived to unit from ED. VSS. A&O. Placed on Tele.Oriented Patient to room & staff. Education provided regarding safety precaution and patient verbalized understanding.

## 2022-12-08 NOTE — Progress Notes (Signed)
Patient was rude refused to answer any questions. Didn't want to verify name and DOB. Stated we can look at his band. Refused to pull his pants down for [place] to check his skin.

## 2022-12-08 NOTE — ED Notes (Signed)
ED TO INPATIENT HANDOFF REPORT  ED Nurse Name and Phone #: [phone]  S Name/Age/Gender Paul Roberts 77 y.o. male Room/Bed: 025C/025C  Code Status   Code Status: Full Code  Home/SNF/Other Home Patient oriented to: self, place, time, and situation Is this baseline? Yes   Triage Complete: Triage complete  Chief Complaint Lactic acid acidosis [E87.20]  Triage Note Pt. Stated, Lavenia Atlas been coughing and coughing , sometimes its a dry cough and sometimes not. Ive also had some fever and sweating.    Allergies Allergies  Allergen Reactions   Ciprofloxacin Other (See Comments)   Rosuvastatin Nausea And Vomiting and Other (See Comments)   Sulfa Antibiotics Other (See Comments)    Pt denies    Level of Care/Admitting Diagnosis ED Disposition     ED Disposition  Admit   Condition  --   Comment  Hospital Area: [name] [[id number]]  Level of Care: Telemetry Medical [104]  May place patient in observation at [facility] or [facility] if equivalent level of care is available:: No  Covid Evaluation: Confirmed COVID Positive  Diagnosis: Lactic acid acidosis [261195]  Admitting Physician: Synetta Fail [[id number]]  Attending Physician: Synetta Fail [phone]          B Medical/Surgery History Past Medical History:  Diagnosis Date   Diabetes mellitus without complication (HCC)    Hypertension    Past Surgical History:  Procedure Laterality Date   NO PAST SURGERIES       A IV Location/Drains/Wounds Patient Lines/Drains/Airways Status     Active Line/Drains/Airways     Name Placement date Placement time Site Days   Peripheral IV 12/08/22 20 G Left Antecubital 12/08/22  1215  Antecubital  less than 1            Intake/Output Last 24 hours No intake or output data in the 24 hours ending 12/08/22 1450  Labs/Imaging Results for orders placed or performed during the hospital encounter of 12/08/22 (from the past 48 hour(s))   Urinalysis, Routine w reflex microscopic -Urine, Clean Catch     Status: Abnormal   Collection Time: 12/08/22 10:11 AM  Result Value Ref Range   Color, Urine YELLOW YELLOW   APPearance CLEAR CLEAR   Specific Gravity, Urine 1.030 1.005 - 1.030   pH 5.0 5.0 - 8.0   Glucose, UA >=500 (A) NEGATIVE mg/dL   Hgb urine dipstick NEGATIVE NEGATIVE   Bilirubin Urine NEGATIVE NEGATIVE   Ketones, ur NEGATIVE NEGATIVE mg/dL   Protein, ur NEGATIVE NEGATIVE mg/dL   Nitrite NEGATIVE NEGATIVE   Leukocytes,Ua NEGATIVE NEGATIVE   RBC / HPF 0-5 0 - 5 RBC/hpf   WBC, UA 0-5 0 - 5 WBC/hpf   Bacteria, UA NONE SEEN NONE SEEN   Squamous Epithelial / HPF 0-5 0 - 5 /HPF    Comment: Performed at [facility], [address] [place], [place] [address]  Comprehensive metabolic panel     Status: Abnormal   Collection Time: 12/08/22 10:18 AM  Result Value Ref Range   Sodium 132 (L) 135 - 145 mmol/L   Potassium 3.1 (L) 3.5 - 5.1 mmol/L   Chloride 95 (L) 98 - 111 mmol/L   CO2 24 22 - 32 mmol/L   Glucose, Bld 447 (H) 70 - 99 mg/dL    Comment: Glucose reference range applies only to samples taken after fasting for at least 8 hours.   BUN 15 8 - 23 mg/dL   Creatinine, Ser 9.62 (  H) 0.61 - 1.24 mg/dL   Calcium 8.6 (L) 8.9 - 10.3 mg/dL   Total Protein 7.0 6.5 - 8.1 g/dL   Albumin 3.6 3.5 - 5.0 g/dL   AST 31 15 - 41 U/L   ALT 31 0 - 44 U/L   Alkaline Phosphatase 49 38 - 126 U/L   Total Bilirubin 0.7 0.3 - 1.2 mg/dL   GFR, Estimated 53 (L) >60 mL/min    Comment: (NOTE) Calculated using the CKD-EPI Creatinine Equation (2021)    Anion gap 13 5 - 15    Comment: Performed at [facility], [address] [place], [place] [address]  CBC with Differential     Status: Abnormal   Collection Time: 12/08/22 10:18 AM  Result Value Ref Range   WBC 4.5 4.0 - 10.5 K/uL   RBC 4.38 4.22 - 5.81 MIL/uL   Hemoglobin 12.3 (L) 13.0 - 17.0 g/dL   HCT 29.5 (L) 28.4 - 13.2 %   MCV 84.5 80.0 - 100.0 fL   MCH 28.1 26.0  - 34.0 pg   MCHC 33.2 30.0 - 36.0 g/dL   RDW 44.0 10.2 - 72.5 %   Platelets 169 150 - 400 K/uL   nRBC 0.0 0.0 - 0.2 %   Neutrophils Relative % 52 %   Neutro Abs 2.4 1.7 - 7.7 K/uL   Lymphocytes Relative 31 %   Lymphs Abs 1.4 0.7 - 4.0 K/uL   Monocytes Relative 14 %   Monocytes Absolute 0.6 0.1 - 1.0 K/uL   Eosinophils Relative 2 %   Eosinophils Absolute 0.1 0.0 - 0.5 K/uL   Basophils Relative 1 %   Basophils Absolute 0.0 0.0 - 0.1 K/uL   Immature Granulocytes 0 %   Abs Immature Granulocytes 0.01 0.00 - 0.07 K/uL    Comment: Performed at [facility], [address], [place], [place] [address]  I-Stat Lactic Acid, ED     Status: Abnormal   Collection Time: 12/08/22 10:23 AM  Result Value Ref Range   Lactic Acid, Venous 3.6 (HH) 0.5 - 1.9 mmol/L   Comment NOTIFIED PHYSICIAN   Resp panel by RT-PCR (RSV, Flu A&B, Covid) Anterior Nasal Swab     Status: Abnormal   Collection Time: 12/08/22 12:44 PM   Specimen: Anterior Nasal Swab  Result Value Ref Range   SARS Coronavirus 2 by RT PCR POSITIVE (A) NEGATIVE   Influenza A by PCR NEGATIVE NEGATIVE   Influenza B by PCR NEGATIVE NEGATIVE    Comment: (NOTE) The Xpert Xpress SARS-CoV-2/FLU/RSV plus assay is intended as an aid in the diagnosis of influenza from Nasopharyngeal swab specimens and should not be used as a sole basis for treatment. Nasal washings and aspirates are unacceptable for Xpert Xpress SARS-CoV-2/FLU/RSV testing.  Fact Sheet for Patients: BloggerCourse.com  Fact Sheet for Healthcare Providers: SeriousBroker.it  This test is not yet approved or cleared by the [place] FDA and has been authorized for detection and/or diagnosis of SARS-CoV-2 by FDA under an Emergency Use Authorization (EUA). This EUA will remain in effect (meaning this test can be used) for the duration of the COVID-19 declaration under Section 564(b)(1) of the Act, 21 U.S.C. section  360bbb-3(b)(1), unless the authorization is terminated or revoked.     Resp Syncytial Virus by PCR NEGATIVE NEGATIVE    Comment: (NOTE) Fact Sheet for Patients: BloggerCourse.com  Fact Sheet for Healthcare Providers: SeriousBroker.it  This test is not yet approved or cleared by the [place] and  has been authorized for detection and/or diagnosis of SARS-CoV-2 by FDA under an Emergency Use Authorization (EUA). This EUA will remain in effect (meaning this test can be used) for the duration of the COVID-19 declaration under Section 564(b)(1) of the Act, 21 U.S.C. section 360bbb-3(b)(1), unless the authorization is terminated or revoked.  Performed at [facility], [address], [place], [place] [address]   I-Stat Lactic Acid, ED     Status: Abnormal   Collection Time: 12/08/22  1:21 PM  Result Value Ref Range   Lactic Acid, Venous 5.5 (HH) 0.5 - 1.9 mmol/L   Comment NOTIFIED PHYSICIAN   I-Stat venous blood gas, ([facility] ED, [facility], [facility])     Status: Abnormal   Collection Time: 12/08/22  2:11 PM  Result Value Ref Range   pH, Ven 7.484 (H) 7.25 - 7.43   pCO2, Ven 33.8 (L) 44 - 60 mmHg   pO2, Ven 74 (H) 32 - 45 mmHg   Bicarbonate 25.4 20.0 - 28.0 mmol/L   TCO2 26 22 - 32 mmol/L   O2 Saturation 96 %   Acid-Base Excess 2.0 0.0 - 2.0 mmol/L   Sodium 135 135 - 145 mmol/L   Potassium 3.9 3.5 - 5.1 mmol/L   Calcium, Ion 0.98 (L) 1.15 - 1.40 mmol/L   HCT 31.0 (L) 39.0 - 52.0 %   Hemoglobin 10.5 (L) 13.0 - 17.0 g/dL   Sample type VENOUS   D-dimer, quantitative     Status: None   Collection Time: 12/08/22  2:15 PM  Result Value Ref Range   D-Dimer, Quant <0.27 0.00 - 0.50 ug/mL-FEU    Comment: (NOTE) At the manufacturer cut-off value of 0.5 g/mL FEU, this assay has a negative predictive value of 95-100%.This assay is intended for use in conjunction with a clinical pretest probability (PTP) assessment model to exclude  pulmonary embolism (PE) and deep venous thrombosis (DVT) in outpatients suspected of PE or DVT. Results should be correlated with clinical presentation. Performed at [facility], [address] [place], [place] [address]    CT Chest Wo Contrast  Result Date: 12/08/2022 CLINICAL DATA:  Respiratory illness, fever, cough, excessive sweating EXAM: CT CHEST WITHOUT CONTRAST TECHNIQUE: Multidetector CT imaging of the chest was performed following the standard protocol without IV contrast. RADIATION DOSE REDUCTION: This exam was performed according to the departmental dose-optimization program which includes automated exposure control, adjustment of the mA and/or kV according to patient size and/or use of iterative reconstruction technique. COMPARISON:  04/18/2021 FINDINGS: Cardiovascular: Aortic atherosclerosis. Normal heart size. Left coronary artery calcifications. No pericardial effusion. Mediastinum/Nodes: No enlarged mediastinal, hilar, or axillary lymph nodes. Small, benign calcified granulomatous mediastinal and bilateral hilar lymph nodes. Thyroid gland, trachea, and esophagus demonstrate no significant findings. Lungs/Pleura: Diffuse bilateral bronchial wall thickening. Benign calcified granulomatous nodule of the medial right lower lobe, requiring no further follow-up or characterization (series 4, image 80). Background of fine centrilobular nodularity concentrated in the lung apices. Dependent bibasilar scarring or partial atelectasis. No pleural effusion or pneumothorax. Upper Abdomen: No acute abnormality.  Hepatic steatosis Musculoskeletal: No chest wall abnormality. No acute osseous findings. IMPRESSION: 1. Diffuse bilateral bronchial wall thickening and background of fine centrilobular nodularity concentrated in the lung apices, consistent with bronchitis and respiratory bronchiolitis. These findings are most commonly seen in smoking-related respiratory bronchiolitis although generally  nonspecific and infectious or inflammatory. No other airspace opacity. 2. Coronary artery disease. 3. Hepatic steatosis. Aortic Atherosclerosis (ICD10-I70.0). Electronically Signed   By: Trinna Post  Jane Canary M.D.   On: 12/08/2022 12:35   DG Chest 2 View  Result Date: 12/08/2022 CLINICAL DATA:  Cough EXAM: CHEST - 2 VIEW COMPARISON:  04/18/2021 FINDINGS: Transverse diameter of heart is slightly increased. There are no signs of pulmonary edema or focal pulmonary consolidation. There is no pleural effusion or pneumothorax. IMPRESSION: No active cardiopulmonary disease. Electronically Signed   By: Ernie Avena M.D.   On: 12/08/2022 10:53    Pending Labs Unresulted Labs (From admission, onward)     Start     Ordered   12/09/22 0500  CBC  Tomorrow morning,   R        12/08/22 1429   12/09/22 0500  Comprehensive metabolic panel  Tomorrow morning,   R        12/08/22 1429   12/08/22 1429  Lactic acid, plasma  (Lactic Acid)  ONCE - URGENT,   URGENT        12/08/22 1429            Vitals/Pain Today's Vitals   12/08/22 1205 12/08/22 1230 12/08/22 1330 12/08/22 1400  BP: (!) 153/74 (!) 157/91 (!) 153/81 (!) 158/87  Pulse: 85 87 89 83  Resp: (!) 21 18 19 11   Temp:      SpO2: 98% 99% 97% 95%  PainSc:        Isolation Precautions No active isolations  Medications Medications  tamsulosin (FLOMAX) capsule 0.8 mg (has no administration in time range)  apixaban (ELIQUIS) tablet 2.5 mg (has no administration in time range)  Glycerin-Hypromellose-PEG 400 0.2-0.2-1 % SOLN 1-2 drop (has no administration in time range)  sodium chloride flush (NS) 0.9 % injection 3 mL (has no administration in time range)  0.9 %  sodium chloride infusion (has no administration in time range)  acetaminophen (TYLENOL) tablet 650 mg (has no administration in time range)    Or  acetaminophen (TYLENOL) suppository 650 mg (has no administration in time range)  polyethylene glycol (MIRALAX / GLYCOLAX) packet 17 g (has  no administration in time range)  lactated ringers bolus 2,000 mL (2,000 mLs Intravenous New Bag/Given 12/08/22 1216)    Mobility walks     Focused Assessments Pulmonary Assessment Handoff:  Lung sounds:   O2 Device: Room Air      R Recommendations: See Admitting Provider Note  Report given to:   Additional Notes:

## 2022-12-08 NOTE — ED Notes (Signed)
DELAY to floor due to lactic greater than 5, can't go to Tele unless 2. Patient will have to wait until repeat resulted

## 2022-12-08 NOTE — H&P (Addendum)
History and Physical   Dick HARUTYUN MONTEVERDE ZOX:[id number] DOB: 05-10-1945 DOA: 12/08/2022  PCP: Merri Brunette, MD   Patient coming from: Home  Chief Complaint: Cough  HPI: Paul Roberts is a 77 y.o. male with medical history significant of essential tremor, abnormal muscular tone, diabetes, hypertension, hyperlipidemia, hypothyroidism, asthma, pulmonary embolism presenting with cough.  Patient reports ongoing severe cough and fever for the past several days.  Denies chills, chest pain, shortness of breath, abdominal pain, constipation, diarrhea, nausea, vomiting.  ED Course: Vital signs in the ED notable for blood pressure in the 140s to 150s systolic.  Lab workup included CMP with sodium 132 which corrects considering glucose of 447, potassium 3.1, chloride 91, creatinine of 1.39 near baseline of 1.2, calcium 8.6.  CBC with hemoglobin 12.3 which is stable.  Lactic acid i-STAT lab was elevated at 3.6 and then 5.6 on repeat.  D-dimer, troponin pending.  Respiratory panel positive for COVID-19.  Urinalysis with glucose only.  VBG pending.  Chest x-ray showed no acute normality.  CT of the chest showed diffuse bronchitis/bronchiolitis which could be consistent with smoking-related versus infectious versus inflammatory.  Patient received 2 L IV fluids in the ED.  Review of Systems: As per HPI otherwise all other systems reviewed and are negative.  Past Medical History:  Diagnosis Date   Diabetes mellitus without complication (HCC)    Hypertension     Past Surgical History:  Procedure Laterality Date   NO PAST SURGERIES      Social History  reports that he has quit smoking. He has never used smokeless tobacco. He reports that he does not drink alcohol and does not use drugs.  Allergies  Allergen Reactions   Ciprofloxacin Other (See Comments)   Rosuvastatin Nausea And Vomiting and Other (See Comments)   Sulfa Antibiotics Other (See Comments)    Pt denies    No family history on  file.   Prior to Admission medications   Medication Sig Start Date End Date Taking? Authorizing Provider  amLODipine (NORVASC) 5 MG tablet Take 5 mg by mouth daily. 03/09/21   [provider]  apixaban (ELIQUIS) 2.5 MG TABS tablet Take 1 tablet (2.5 mg total) by mouth 2 (two) times daily. 01/03/22   Jaci Standard, MD  atorvastatin (LIPITOR) 20 MG tablet Take 20 mg by mouth daily. 04/01/21   [provider]  glimepiride (AMARYL) 1 MG tablet Take 1 mg by mouth daily with breakfast.    [provider]  Glycerin-Hypromellose-PEG 400 (DRY EYE RELIEF DROPS OP) Apply 1-2 drops to eye as needed (dryness).    [provider]  metFORMIN (GLUCOPHAGE-XR) 500 MG 24 hr tablet Take 500 mg by mouth 2 (two) times daily. 09/27/17   [provider]  Multiple Vitamin (MULTI-VITAMIN) tablet Take 1 tablet by mouth daily.    [provider]  NOVOLIN 70/30 KWIKPEN (70-30) 100 UNIT/ML KwikPen Inject 8 Units into the skin 2 (two) times daily. 03/12/21   [provider]  tamsulosin (FLOMAX) 0.4 MG CAPS capsule Take 0.8 mg by mouth daily. 03/17/21   [provider]  valsartan-hydrochlorothiazide (DIOVAN-HCT) 160-25 MG per tablet Take 1 tablet by mouth at bedtime.     [provider]    Physical Exam: Vitals:   12/08/22 1205 12/08/22 1230 12/08/22 1330 12/08/22 1400  BP: (!) 153/74 (!) 157/91 (!) 153/81 (!) 158/87  Pulse: 85 87 89 83  Resp: (!) 21 18 19 11   Temp:  SpO2: 98% 99% 97% 95%    Physical Exam Constitutional:      General: He is not in acute distress.    Appearance: Normal appearance.  HENT:     Head: Normocephalic and atraumatic.     Mouth/Throat:     Mouth: Mucous membranes are moist.     Pharynx: Oropharynx is clear.  Eyes:     Extraocular Movements: Extraocular movements intact.     Pupils: Pupils are equal, round, and reactive to light.  Cardiovascular:     Rate and Rhythm: Normal rate and regular rhythm.      Pulses: Normal pulses.     Heart sounds: Normal heart sounds.  Pulmonary:     Effort: Pulmonary effort is normal. No respiratory distress.     Breath sounds: Normal breath sounds.  Abdominal:     General: Bowel sounds are normal. There is no distension.     Palpations: Abdomen is soft.     Tenderness: There is no abdominal tenderness.  Musculoskeletal:        General: No swelling or deformity.  Skin:    General: Skin is warm and dry.  Neurological:     General: No focal deficit present.     Mental Status: Mental status is at baseline.     Comments: tremor     Labs on Admission: I have personally reviewed following labs and imaging studies  CBC: Recent Labs  Lab 12/08/22 1018 12/08/22 1411  WBC 4.5  --   NEUTROABS 2.4  --   HGB 12.3* 10.5*  HCT 37.0* 31.0*  MCV 84.5  --   PLT 169  --     Basic Metabolic Panel: Recent Labs  Lab 12/08/22 1018 12/08/22 1411  NA 132* 135  K 3.1* 3.9  CL 95*  --   CO2 24  --   GLUCOSE 447*  --   BUN 15  --   CREATININE 1.39*  --   CALCIUM 8.6*  --     GFR: CrCl cannot be calculated (Unknown ideal weight.).  Liver Function Tests: Recent Labs  Lab 12/08/22 1018  AST 31  ALT 31  ALKPHOS 49  BILITOT 0.7  PROT 7.0  ALBUMIN 3.6    Urine analysis:    Component Value Date/Time   COLORURINE YELLOW 12/08/2022 1011   APPEARANCEUR CLEAR 12/08/2022 1011   LABSPEC 1.030 12/08/2022 1011   PHURINE 5.0 12/08/2022 1011   GLUCOSEU >=500 (A) 12/08/2022 1011   HGBUR NEGATIVE 12/08/2022 1011   BILIRUBINUR NEGATIVE 12/08/2022 1011   KETONESUR NEGATIVE 12/08/2022 1011   PROTEINUR NEGATIVE 12/08/2022 1011   UROBILINOGEN 1.0 03/21/2013 0815   NITRITE NEGATIVE 12/08/2022 1011   LEUKOCYTESUR NEGATIVE 12/08/2022 1011    Radiological Exams on Admission: CT Chest Wo Contrast  Result Date: 12/08/2022 CLINICAL DATA:  Respiratory illness, fever, cough, excessive sweating EXAM: CT CHEST WITHOUT CONTRAST TECHNIQUE: Multidetector CT imaging  of the chest was performed following the standard protocol without IV contrast. RADIATION DOSE REDUCTION: This exam was performed according to the departmental dose-optimization program which includes automated exposure control, adjustment of the mA and/or kV according to patient size and/or use of iterative reconstruction technique. COMPARISON:  04/18/2021 FINDINGS: Cardiovascular: Aortic atherosclerosis. Normal heart size. Left coronary artery calcifications. No pericardial effusion. Mediastinum/Nodes: No enlarged mediastinal, hilar, or axillary lymph nodes. Small, benign calcified granulomatous mediastinal and bilateral hilar lymph nodes. Thyroid gland, trachea, and esophagus demonstrate no significant findings. Lungs/Pleura: Diffuse bilateral bronchial wall thickening. Benign calcified granulomatous nodule of  the medial right lower lobe, requiring no further follow-up or characterization (series 4, image 80). Background of fine centrilobular nodularity concentrated in the lung apices. Dependent bibasilar scarring or partial atelectasis. No pleural effusion or pneumothorax. Upper Abdomen: No acute abnormality.  Hepatic steatosis Musculoskeletal: No chest wall abnormality. No acute osseous findings. IMPRESSION: 1. Diffuse bilateral bronchial wall thickening and background of fine centrilobular nodularity concentrated in the lung apices, consistent with bronchitis and respiratory bronchiolitis. These findings are most commonly seen in smoking-related respiratory bronchiolitis although generally nonspecific and infectious or inflammatory. No other airspace opacity. 2. Coronary artery disease. 3. Hepatic steatosis. Aortic Atherosclerosis (ICD10-I70.0). Electronically Signed   By: Jearld Lesch M.D.   On: 12/08/2022 12:35   DG Chest 2 View  Result Date: 12/08/2022 CLINICAL DATA:  Cough EXAM: CHEST - 2 VIEW COMPARISON:  04/18/2021 FINDINGS: Transverse diameter of heart is slightly increased. There are no signs of  pulmonary edema or focal pulmonary consolidation. There is no pleural effusion or pneumothorax. IMPRESSION: No active cardiopulmonary disease. Electronically Signed   By: Ernie Avena M.D.   On: 12/08/2022 10:53    EKG: Not performed in the emergency department  Assessment/Plan Principal Problem:   Lactic acid acidosis Active Problems:   Benign essential tremor   Diabetes (HCC)   Essential hypertension   Hypothyroidism   Hyperlipidemia   COVID-19 virus infection   Lactic acidosis COVID-19 infection > Patient reporting cough and** > Found to have COVID-19 infection.  Surprisingly noted to have lactic acidosis on i-STAT of 3.6 and then 5.5 on repeat despite receiving 2 L of IV fluids. > Does have history of abnormal muscle tone and significant tremor unclear if he has elevated lactic acid at baseline.  Unclear if significant recurrent coughing could be related however less likely given significant rise while in ED.  Currently is hemodynamically stable.   - Monitor on telemetry overnight - Continue with IV fluids switch to normal saline - Trend lactic acid, will get repeat formal lactic acid instead of i-STAT now to verify - Hold metformin - Supportive care - Paxlovid contraindicated with eliquis  Hypertension - Holding home amlodipine, valsartan-hydrochlorothiazide in the setting of elevated lactic acid and ruling out developing SIRS  Hyperlipidemia - Holding home atorvastatin while investigating lactic acid doses  Diabetes - Holding home metformin as above - SSI  History of PE - Continue home Eliquis  Essential tremor Abnormal muscle tone - Follows with neurology, currently not significant response to medication so is not currently on any.  Has declined surgical/stimulator intervention.  DVT prophylaxis: Eliquis Code Status:   Full Family Communication:  None on admission  Disposition Plan:   Patient is from:  Home  Anticipated DC to:  Home  Anticipated DC  date:  1 to 3 days  Anticipated DC barriers: None  Consults called:  None Admission status:  Observation, telemetry  Severity of Illness: The appropriate patient status for this patient is OBSERVATION. Observation status is judged to be reasonable and necessary in order to provide the required intensity of service to ensure the patient's safety. The patient's presenting symptoms, physical exam findings, and initial radiographic and laboratory data in the context of their medical condition is felt to place them at decreased risk for further clinical deterioration. Furthermore, it is anticipated that the patient will be medically stable for discharge from the hospital within 2 midnights of admission.    Synetta Fail MD [facility]  How to contact the [facility] Attending or Consulting provider  7A - 7P or covering provider during after hours 7P -7A, for this patient?   Check the care team in [facility] and look for a) attending/consulting [facility] provider listed and b) the [facility] team listed Log into [email or web address] and use [facility]'s universal password to access. If you do not have the password, please contact the hospital operator. Locate the [facility] provider you are looking for under [facility] and page to a number that you can be directly reached. If you still have difficulty reaching the provider, please page the [facility] (Director on Call) for the Hospitalists listed on amion for assistance.  12/08/2022, 2:32 PM

## 2022-12-08 NOTE — ED Triage Notes (Signed)
Pt. Stated, Paul Roberts been coughing and coughing , sometimes its a dry cough and sometimes not. Ive also had some fever and sweating.

## 2022-12-08 NOTE — ED Provider Notes (Signed)
[place] EMERGENCY DEPARTMENT AT [facility] Provider Note  CSN: [id number] Arrival date & time: 12/08/22 5284  Chief Complaint(s) Cough, Fever, and Excessive Sweating  HPI Paul Roberts is a 77 y.o. male with history of diabetes, hypertension presenting to the emergency department with cough.  Patient reports cough, minimal sputum, congestion, fevers and sweats.  Denies any chest pain, nausea or vomiting, difficulty breathing.  No lightheadedness or dizziness.  No fainting.  No abdominal pain.  No urinary symptoms.  Symptoms are moderate.   Past Medical History Past Medical History:  Diagnosis Date   Diabetes mellitus without complication (HCC)    Hypertension    Patient Active Problem List   Diagnosis Date Noted   COVID-19 virus infection 12/08/2022   Lactic acid acidosis 12/08/2022   Pulmonary embolism (HCC) 04/18/2021   Elevated troponin 04/18/2021   Hypokalemia 04/18/2021   Diabetes (HCC) 04/18/2021   Essential hypertension 04/18/2021   Hypothyroidism 12/21/2020   Hyperlipidemia 12/21/2020   Exacerbation of intermittent asthma 12/21/2020   Grief reaction 08/13/2019   Benign essential tremor 08/13/2019   Tremor, coarse 04/14/2019   Abnormal increased muscle tone 04/14/2019   Tremor of left hand 04/14/2019   Eye movement abnormality 04/14/2019   Unresolved grief 04/14/2019   Mixed action and resting tremor 04/14/2019   Home Medication(s) Prior to Admission medications   Medication Sig Start Date End Date Taking? Authorizing Provider  amLODipine (NORVASC) 5 MG tablet Take 5 mg by mouth daily. 03/09/21   [provider]  apixaban (ELIQUIS) 2.5 MG TABS tablet Take 1 tablet (2.5 mg total) by mouth 2 (two) times daily. 01/03/22   Jaci Standard, MD  atorvastatin (LIPITOR) 20 MG tablet Take 20 mg by mouth daily. 04/01/21   [provider]  glimepiride (AMARYL) 1 MG tablet Take 1 mg by mouth daily with breakfast.    [provider]   Glycerin-Hypromellose-PEG 400 (DRY EYE RELIEF DROPS OP) Apply 1-2 drops to eye as needed (dryness).    [provider]  metFORMIN (GLUCOPHAGE-XR) 500 MG 24 hr tablet Take 500 mg by mouth 2 (two) times daily. 09/27/17   [provider]  Multiple Vitamin (MULTI-VITAMIN) tablet Take 1 tablet by mouth daily.    [provider]  NOVOLIN 70/30 KWIKPEN (70-30) 100 UNIT/ML KwikPen Inject 8 Units into the skin 2 (two) times daily. 03/12/21   [provider]  tamsulosin (FLOMAX) 0.4 MG CAPS capsule Take 0.8 mg by mouth daily. 03/17/21   [provider]  valsartan-hydrochlorothiazide (DIOVAN-HCT) 160-25 MG per tablet Take 1 tablet by mouth at bedtime.     [provider]                                                                                                                                    Past Surgical History Past Surgical History:  Procedure Laterality Date   NO PAST SURGERIES  Family History No family history on file.  Social History Social History   Tobacco Use   Smoking status: Former   Smokeless tobacco: Never  Advertising account planner   Vaping status: Never Used  Substance Use Topics   Alcohol use: No   Drug use: No   Allergies Ciprofloxacin, Rosuvastatin, and Sulfa antibiotics  Review of Systems Review of Systems  All other systems reviewed and are negative.   Physical Exam Vital Signs  I have reviewed the triage vital signs BP (!) 158/87   Pulse 83   Temp 98.7 F (37.1 C)   Resp 11   SpO2 95%  Physical Exam Vitals and nursing note reviewed.  Constitutional:      General: He is not in acute distress.    Appearance: Normal appearance.  HENT:     Mouth/Throat:     Mouth: Mucous membranes are dry.  Eyes:     Conjunctiva/sclera: Conjunctivae normal.  Cardiovascular:     Rate and Rhythm: Normal rate and regular rhythm.  Pulmonary:     Effort: Pulmonary effort is normal. No respiratory distress.     Breath sounds:  Examination of the right-lower field reveals rales. Examination of the left-lower field reveals rales. Rales present.  Abdominal:     General: Abdomen is flat.     Palpations: Abdomen is soft.     Tenderness: There is no abdominal tenderness.  Musculoskeletal:     Right lower leg: No edema.     Left lower leg: No edema.  Skin:    General: Skin is warm and dry.     Capillary Refill: Capillary refill takes less than 2 seconds.  Neurological:     Mental Status: He is alert and oriented to person, place, and time. Mental status is at baseline.  Psychiatric:        Mood and Affect: Mood normal.        Behavior: Behavior normal.     ED Results and Treatments Labs (all labs ordered are listed, but only abnormal results are displayed) Labs Reviewed  RESP PANEL BY RT-PCR (RSV, FLU A&B, COVID)  RVPGX2 - Abnormal; Notable for the following components:      Result Value   SARS Coronavirus 2 by RT PCR POSITIVE (*)    All other components within normal limits  COMPREHENSIVE METABOLIC PANEL - Abnormal; Notable for the following components:   Sodium 132 (*)    Potassium 3.1 (*)    Chloride 95 (*)    Glucose, Bld 447 (*)    Creatinine, Ser 1.39 (*)    Calcium 8.6 (*)    GFR, Estimated 53 (*)    All other components within normal limits  CBC WITH DIFFERENTIAL/PLATELET - Abnormal; Notable for the following components:   Hemoglobin 12.3 (*)    HCT 37.0 (*)    All other components within normal limits  URINALYSIS, ROUTINE W REFLEX MICROSCOPIC - Abnormal; Notable for the following components:   Glucose, UA >=500 (*)    All other components within normal limits  I-STAT CG4 LACTIC ACID, ED - Abnormal; Notable for the following components:   Lactic Acid, Venous 3.6 (*)    All other components within normal limits  I-STAT CG4 LACTIC ACID, ED - Abnormal; Notable for the following components:   Lactic Acid, Venous 5.5 (*)    All other components within normal limits  I-STAT VENOUS BLOOD GAS, ED  - Abnormal; Notable for the following components:   pH, Ven 7.484 (*)  pCO2, Ven 33.8 (*)    pO2, Ven 74 (*)    Calcium, Ion 0.98 (*)    HCT 31.0 (*)    Hemoglobin 10.5 (*)    All other components within normal limits  D-DIMER, QUANTITATIVE  LACTIC ACID, PLASMA  TROPONIN I (HIGH SENSITIVITY)                                                                                                                          Radiology CT Chest Wo Contrast  Result Date: 12/08/2022 CLINICAL DATA:  Respiratory illness, fever, cough, excessive sweating EXAM: CT CHEST WITHOUT CONTRAST TECHNIQUE: Multidetector CT imaging of the chest was performed following the standard protocol without IV contrast. RADIATION DOSE REDUCTION: This exam was performed according to the departmental dose-optimization program which includes automated exposure control, adjustment of the mA and/or kV according to patient size and/or use of iterative reconstruction technique. COMPARISON:  04/18/2021 FINDINGS: Cardiovascular: Aortic atherosclerosis. Normal heart size. Left coronary artery calcifications. No pericardial effusion. Mediastinum/Nodes: No enlarged mediastinal, hilar, or axillary lymph nodes. Small, benign calcified granulomatous mediastinal and bilateral hilar lymph nodes. Thyroid gland, trachea, and esophagus demonstrate no significant findings. Lungs/Pleura: Diffuse bilateral bronchial wall thickening. Benign calcified granulomatous nodule of the medial right lower lobe, requiring no further follow-up or characterization (series 4, image 80). Background of fine centrilobular nodularity concentrated in the lung apices. Dependent bibasilar scarring or partial atelectasis. No pleural effusion or pneumothorax. Upper Abdomen: No acute abnormality.  Hepatic steatosis Musculoskeletal: No chest wall abnormality. No acute osseous findings. IMPRESSION: 1. Diffuse bilateral bronchial wall thickening and background of fine centrilobular  nodularity concentrated in the lung apices, consistent with bronchitis and respiratory bronchiolitis. These findings are most commonly seen in smoking-related respiratory bronchiolitis although generally nonspecific and infectious or inflammatory. No other airspace opacity. 2. Coronary artery disease. 3. Hepatic steatosis. Aortic Atherosclerosis (ICD10-I70.0). Electronically Signed   By: Jearld Lesch M.D.   On: 12/08/2022 12:35   DG Chest 2 View  Result Date: 12/08/2022 CLINICAL DATA:  Cough EXAM: CHEST - 2 VIEW COMPARISON:  04/18/2021 FINDINGS: Transverse diameter of heart is slightly increased. There are no signs of pulmonary edema or focal pulmonary consolidation. There is no pleural effusion or pneumothorax. IMPRESSION: No active cardiopulmonary disease. Electronically Signed   By: Ernie Avena M.D.   On: 12/08/2022 10:53    Pertinent labs & imaging results that were available during my care of the patient were reviewed by me and considered in my medical decision making (see MDM for details).  Medications Ordered in ED Medications  tamsulosin (FLOMAX) capsule 0.8 mg (has no administration in time range)  apixaban (ELIQUIS) tablet 2.5 mg (has no administration in time range)  Glycerin-Hypromellose-PEG 400 0.2-0.2-1 % SOLN 1-2 drop (has no administration in time range)  sodium chloride flush (NS) 0.9 % injection 3 mL (has no administration in time range)  0.9 %  sodium chloride infusion (has no administration in time range)  acetaminophen (TYLENOL) tablet  650 mg (has no administration in time range)    Or  acetaminophen (TYLENOL) suppository 650 mg (has no administration in time range)  polyethylene glycol (MIRALAX / GLYCOLAX) packet 17 g (has no administration in time range)  lactated ringers bolus 2,000 mL (2,000 mLs Intravenous New Bag/Given 12/08/22 1216)                                                                                                                                      Procedures .Critical Care  Performed by: Lonell Grandchild, MD Authorized by: Lonell Grandchild, MD   Critical care provider statement:    Critical care time (minutes):  30   Critical care was necessary to treat or prevent imminent or life-threatening deterioration of the following conditions: lactic acidosis.   Critical care was time spent personally by me on the following activities:  Development of treatment plan with patient or surrogate, discussions with consultants, evaluation of patient's response to treatment, examination of patient, ordering and review of laboratory studies, ordering and review of radiographic studies, ordering and performing treatments and interventions, pulse oximetry, re-evaluation of patient's condition and review of old charts   Care discussed with: admitting provider     (including critical care time)  Medical Decision Making / ED Course   MDM:  77 year old male presenting to the emergency department with cough.  Patient overall well-appearing, appears mildly dehydrated.  Does have some trace crackles in his bilateral lung bases.  Frequent cough.  Ultimately workup notable for positive COVID test.  Prior to results of COVID test, patient had chest x-ray which was negative.  Given his pulmonary exam pursued CT chest which demonstrated findings of bronchiolitis which would correlate with his physical exam findings.  Patient not hypoxic or short of breath.  Suspect this is all due to underlying COVID infection.  Laboratory testing overall reassuring with exception of lactic acid, hyperglycemia without signs of DKA.  Lactic acid was obtained in triage.  Initial test was elevated.  Repeat test after 2 L of fluid increased to 5.5.  Unclear cause of elevated lactic acid.  Patient not hypotensive.  Does not appear septic.  Has no abdominal pain, nausea vomiting or diarrhea to suggest occult gut ischemia.  His extremities are warm and well-perfused.  Could be  type B lactic acidosis.  His VBG is reassuring.  Given elevated lactic acid which actually up trended with additional fluid resuscitation, discussed with the hospitalist who will admit patient for observation.      Additional history obtained: -External records from outside source obtained and reviewed including: Chart review including previous notes, labs, imaging, consultation notes including previous ER visit for PE, d-dimer negative today   Lab Tests: -I ordered, reviewed, and interpreted labs.   The pertinent results include:   Labs Reviewed  RESP PANEL BY RT-PCR (RSV, FLU A&B, COVID)  RVPGX2 - Abnormal; Notable for the following  components:      Result Value   SARS Coronavirus 2 by RT PCR POSITIVE (*)    All other components within normal limits  COMPREHENSIVE METABOLIC PANEL - Abnormal; Notable for the following components:   Sodium 132 (*)    Potassium 3.1 (*)    Chloride 95 (*)    Glucose, Bld 447 (*)    Creatinine, Ser 1.39 (*)    Calcium 8.6 (*)    GFR, Estimated 53 (*)    All other components within normal limits  CBC WITH DIFFERENTIAL/PLATELET - Abnormal; Notable for the following components:   Hemoglobin 12.3 (*)    HCT 37.0 (*)    All other components within normal limits  URINALYSIS, ROUTINE W REFLEX MICROSCOPIC - Abnormal; Notable for the following components:   Glucose, UA >=500 (*)    All other components within normal limits  I-STAT CG4 LACTIC ACID, ED - Abnormal; Notable for the following components:   Lactic Acid, Venous 3.6 (*)    All other components within normal limits  I-STAT CG4 LACTIC ACID, ED - Abnormal; Notable for the following components:   Lactic Acid, Venous 5.5 (*)    All other components within normal limits  I-STAT VENOUS BLOOD GAS, ED - Abnormal; Notable for the following components:   pH, Ven 7.484 (*)    pCO2, Ven 33.8 (*)    pO2, Ven 74 (*)    Calcium, Ion 0.98 (*)    HCT 31.0 (*)    Hemoglobin 10.5 (*)    All other components  within normal limits  D-DIMER, QUANTITATIVE  LACTIC ACID, PLASMA  TROPONIN I (HIGH SENSITIVITY)    Notable for uptrending lactic acid of unclear cause, hyperglycemia, negative d-dimer      Imaging Studies ordered: I ordered imaging studies including CT chest On my interpretation imaging demonstrates bronchiolitis  I independently visualized and interpreted imaging. I agree with the radiologist interpretation   Medicines ordered and prescription drug management: Meds ordered this encounter  Medications   lactated ringers bolus 2,000 mL   tamsulosin (FLOMAX) capsule 0.8 mg   apixaban (ELIQUIS) tablet 2.5 mg   Glycerin-Hypromellose-PEG 400 0.2-0.2-1 % SOLN 1-2 drop   sodium chloride flush (NS) 0.9 % injection 3 mL   0.9 %  sodium chloride infusion   OR Linked Order Group    acetaminophen (TYLENOL) tablet 650 mg    acetaminophen (TYLENOL) suppository 650 mg   polyethylene glycol (MIRALAX / GLYCOLAX) packet 17 g    -I have reviewed the patients home medicines and have made adjustments as needed   Consultations Obtained: I requested consultation with the hospitalist,  and discussed lab and imaging findings as well as pertinent plan - they recommend: admission   Cardiac Monitoring: The patient was maintained on a cardiac monitor.  I personally viewed and interpreted the cardiac monitored which showed an underlying rhythm of: NSR    Reevaluation: After the interventions noted above, I reevaluated the patient and found that their symptoms have improved  Co morbidities that complicate the patient evaluation  Past Medical History:  Diagnosis Date   Diabetes mellitus without complication (HCC)    Hypertension       Dispostion: Disposition decision including need for hospitalization was considered, and patient admitted to the hospital.    Final Clinical Impression(s) / ED Diagnoses Final diagnoses:  Lactic acid acidosis     This chart was dictated using voice  recognition software.  Despite best efforts to proofread,  errors can occur  which can change the documentation meaning.    Lonell Grandchild, MD 12/08/22 1455

## 2022-12-09 ENCOUNTER — Encounter (HOSPITAL_COMMUNITY): Payer: Self-pay | Admitting: Pharmacist Clinician (PhC)/ Clinical Pharmacy Specialist

## 2022-12-09 DIAGNOSIS — E872 Acidosis, unspecified: Secondary | ICD-10-CM

## 2022-12-09 LAB — COMPREHENSIVE METABOLIC PANEL WITH GFR
ALT: 26 U/L (ref 0–44)
AST: 27 U/L (ref 15–41)
Albumin: 3.3 g/dL — ABNORMAL LOW (ref 3.5–5.0)
Alkaline Phosphatase: 44 U/L (ref 38–126)
Anion gap: 11 (ref 5–15)
BUN: 10 mg/dL (ref 8–23)
CO2: 24 mmol/L (ref 22–32)
Calcium: 8.1 mg/dL — ABNORMAL LOW (ref 8.9–10.3)
Chloride: 100 mmol/L (ref 98–111)
Creatinine, Ser: 1.08 mg/dL (ref 0.61–1.24)
GFR, Estimated: >60 mL/min (ref >60)
Glucose, Bld: 234 mg/dL — ABNORMAL HIGH (ref 70–99)
Potassium: 3.4 mmol/L — ABNORMAL LOW (ref 3.5–5.1)
Sodium: 135 mmol/L (ref 135–145)
Total Bilirubin: 0.9 mg/dL (ref 0.3–1.2)
Total Protein: 6.5 g/dL (ref 6.5–8.1)

## 2022-12-09 LAB — CBC
HCT: 34.9 % — ABNORMAL LOW (ref 39.0–52.0)
Hemoglobin: 11.5 g/dL — ABNORMAL LOW (ref 13.0–17.0)
MCH: 27.6 pg (ref 26.0–34.0)
MCHC: 33 g/dL (ref 30.0–36.0)
MCV: 83.9 fL (ref 80.0–100.0)
Platelets: 172 10*3/uL (ref 150–400)
RBC: 4.16 MIL/uL — ABNORMAL LOW (ref 4.22–5.81)
RDW: 13.5 % (ref 11.5–15.5)
WBC: 4.5 10*3/uL (ref 4.0–10.5)
nRBC: 0 % (ref 0.0–0.2)

## 2022-12-09 MED ORDER — SODIUM CHLORIDE 0.9 % IV SOLN
200.0000 mg | Freq: Once | INTRAVENOUS | Status: DC
  Filled 2022-12-09: qty 40

## 2022-12-09 MED ORDER — SODIUM CHLORIDE 0.9 % IV SOLN: 100.0000 mg | Freq: Every day | INTRAVENOUS | Status: DC

## 2022-12-09 NOTE — Progress Notes (Addendum)
Pt confused. He pulled out his IV. He said he is going home and does not need it. He wants to talk to the doctor in the morning. Messaged Dr. Dartha Lodge and he replied Ok.

## 2022-12-09 NOTE — Discharge Summary (Signed)
Physician Discharge Summary  Patient ID: LUDWIG TUGWELL MRN: [id number] DOB/AGE: Jul 05, 1945 77 y.o.  Admit date: 12/08/2022 Discharge date: 12/09/2022  Admission Diagnoses:  Discharge Diagnoses:  Principal Problem:   Lactic acid acidosis Active Problems:   Benign essential tremor   Diabetes (HCC)   Essential hypertension   Hyperlipidemia   COVID-19 virus infection   Discharged Condition: stable  Hospital Course: Patient is a 77 year old male with past medical history significant for diabetes mellitus, hypertension, hyperlipidemia, asthma, pulmonary embolism on anticoagulation, asthma, essential tremor and abnormal muscular tone.  Patient was admitted with fever and severe cough.  Patient tested positive for COVID.  Patient has remained on room air.  Patient has insisted on being discharged back home today.  Patient will be discharged to the care of the primary care provider.  Patient will follow-up primary care provider within 1 week of discharge.  Lactic acidosis COVID-19 infection > Admitted with fever and cough.  > Found to have COVID-19 infection.  Surprisingly noted to have lactic acidosis on i-STAT of 3.6 and then 5.5 on repeat despite receiving 2 L of IV fluids.  Lactic acid level has improved to 2.4.  Fever has resolved.  Cough has improved significantly. -Patient was managed supportively. - Paxlovid contraindicated with eliquis -Patient has remained on room air. -Patient has insisted on being discharged back home today.     Hypertension -Continue home medications.     Hyperlipidemia -Resume home medications.     Diabetes -Resume home regimen.  History of PE - Continue home Eliquis   Essential tremor Abnormal muscle tone - Follows with neurology, currently not significant response to medication so is not currently on any.  Has declined surgical/stimulator intervention.    Consults: None  Significant Diagnostic Studies:  COVID-19 came back  positive.  Treatments: Patient was managed supportively.  Discharge Exam: Blood pressure (!) 166/85, pulse 79, temperature 98.5 F (36.9 C), resp. rate 18, SpO2 100%.   Disposition: Discharge disposition: 01-Home or Self Care       Discharge Instructions     Diet - low sodium heart healthy   Complete by: As directed    Diet Carb Modified   Complete by: As directed    Increase activity slowly   Complete by: As directed       Allergies as of 12/09/2022       Reactions   Ciprofloxacin Other (See Comments)   Rosuvastatin Nausea And Vomiting, Other (See Comments)   Sulfa Antibiotics Other (See Comments)   Pt denies        Medication List     STOP taking these medications    traMADol 50 MG tablet Commonly known as: ULTRAM       TAKE these medications    amLODipine 5 MG tablet Commonly known as: NORVASC Take 5 mg by mouth daily.   apixaban 2.5 MG Tabs tablet Commonly known as: ELIQUIS Take 1 tablet (2.5 mg total) by mouth 2 (two) times daily.   atorvastatin 20 MG tablet Commonly known as: LIPITOR Take 20 mg by mouth daily.   DRY EYE RELIEF DROPS OP Apply 1-2 drops to eye as needed (dryness).   glimepiride 1 MG tablet Commonly known as: AMARYL Take 1 mg by mouth daily with breakfast.   metFORMIN 500 MG 24 hr tablet Commonly known as: GLUCOPHAGE-XR Take 500 mg by mouth 2 (two) times daily.   Multi-Vitamin tablet Take 1 tablet by mouth daily.   NovoLIN 70/30 Kwikpen (70-30) 100 UNIT/ML KwikPen Generic  drug: insulin isophane & regular human KwikPen Inject 8 Units into the skin 2 (two) times daily.   tamsulosin 0.4 MG Caps capsule Commonly known as: FLOMAX Take 0.8 mg by mouth daily.   valsartan-hydrochlorothiazide 160-25 MG tablet Commonly known as: DIOVAN-HCT Take 1 tablet by mouth at bedtime.        Time spent: 35 minutes.   SignedBarnetta Chapel 12/09/2022, 1:07 PM

## 2022-12-09 NOTE — Progress Notes (Signed)
Dr. Loney Loh was paged, "Pt refused his insulin because he said it is different from what he takes at home. He also refused to take his Eliquis. I educated him on both meds, and he still refused."

## 2022-12-09 NOTE — Plan of Care (Signed)
  Problem: Coping: Goal: Ability to adjust to condition or change in health will improve Outcome: Progressing   

## 2022-12-09 NOTE — Progress Notes (Signed)
Dr. Loney Loh notified that pt refuses to wear the tele monitor. He said he tried his best, but it makes his head hurt.

## 2022-12-09 NOTE — Progress Notes (Signed)
Unable to do a full assessment on this patient. He continues to refuse any care and does not allow staff to assess him. Patient seems oriented but refuses to answer if he knows the date. He is up ambulating in his room and eating his lunch at the moment. He understands that he can discharge if a family member comes to pick him up. He said he would call someone to come get him.

## 2023-01-01 ENCOUNTER — Other Ambulatory Visit: Payer: Self-pay | Admitting: Physician Assistant

## 2023-01-01 DIAGNOSIS — E118 Type 2 diabetes mellitus with unspecified complications: Secondary | ICD-10-CM | POA: Diagnosis not present

## 2023-01-01 DIAGNOSIS — I2694 Multiple subsegmental pulmonary emboli without acute cor pulmonale: Secondary | ICD-10-CM

## 2023-01-02 ENCOUNTER — Inpatient Hospital Stay: Payer: Medicare Other

## 2023-01-02 ENCOUNTER — Inpatient Hospital Stay: Payer: Medicare Other | Attending: Hematology and Oncology | Admitting: Physician Assistant

## 2023-01-02 ENCOUNTER — Other Ambulatory Visit: Payer: Medicare Other

## 2023-01-02 VITALS — BP 127/69 | HR 82 | Temp 97.9°F | Resp 16 | Wt 172.8 lb

## 2023-01-02 DIAGNOSIS — Z7901 Long term (current) use of anticoagulants: Secondary | ICD-10-CM | POA: Insufficient documentation

## 2023-01-02 DIAGNOSIS — E1165 Type 2 diabetes mellitus with hyperglycemia: Secondary | ICD-10-CM | POA: Insufficient documentation

## 2023-01-02 DIAGNOSIS — I2694 Multiple subsegmental pulmonary emboli without acute cor pulmonale: Secondary | ICD-10-CM

## 2023-01-02 DIAGNOSIS — I2699 Other pulmonary embolism without acute cor pulmonale: Secondary | ICD-10-CM | POA: Diagnosis not present

## 2023-01-02 DIAGNOSIS — Z86711 Personal history of pulmonary embolism: Secondary | ICD-10-CM

## 2023-01-02 LAB — CMP (CANCER CENTER ONLY)
ALT: 14 U/L (ref 0–44)
AST: 15 U/L (ref 15–41)
Albumin: 4.1 g/dL (ref 3.5–5.0)
Alkaline Phosphatase: 47 U/L (ref 38–126)
Anion gap: 10 (ref 5–15)
BUN: 11 mg/dL (ref 8–23)
CO2: 29 mmol/L (ref 22–32)
Calcium: 9.2 mg/dL (ref 8.9–10.3)
Chloride: 100 mmol/L (ref 98–111)
Creatinine: 1.17 mg/dL (ref 0.61–1.24)
GFR, Estimated: >60 mL/min (ref >60)
Glucose, Bld: 275 mg/dL — ABNORMAL HIGH (ref 70–99)
Potassium: 3.6 mmol/L (ref 3.5–5.1)
Sodium: 139 mmol/L (ref 135–145)
Total Bilirubin: 0.5 mg/dL (ref 0.3–1.2)
Total Protein: 7.1 g/dL (ref 6.5–8.1)

## 2023-01-02 LAB — CBC WITH DIFFERENTIAL (CANCER CENTER ONLY)
Abs Immature Granulocytes: 0.02 10*3/uL (ref 0.00–0.07)
Basophils Absolute: 0.1 10*3/uL (ref 0.0–0.1)
Basophils Relative: 1 %
Eosinophils Absolute: 0.2 10*3/uL (ref 0.0–0.5)
Eosinophils Relative: 4 %
HCT: 37.1 % — ABNORMAL LOW (ref 39.0–52.0)
Hemoglobin: 12.2 g/dL — ABNORMAL LOW (ref 13.0–17.0)
Immature Granulocytes: 0 %
Lymphocytes Relative: 34 %
Lymphs Abs: 1.9 10*3/uL (ref 0.7–4.0)
MCH: 27.4 pg (ref 26.0–34.0)
MCHC: 32.9 g/dL (ref 30.0–36.0)
MCV: 83.2 fL (ref 80.0–100.0)
Monocytes Absolute: 0.5 10*3/uL (ref 0.1–1.0)
Monocytes Relative: 8 %
Neutro Abs: 3 10*3/uL (ref 1.7–7.7)
Neutrophils Relative %: 53 %
Platelet Count: 213 10*3/uL (ref 150–400)
RBC: 4.46 MIL/uL (ref 4.22–5.81)
RDW: 13.5 % (ref 11.5–15.5)
WBC Count: 5.6 10*3/uL (ref 4.0–10.5)
nRBC: 0 % (ref 0.0–0.2)

## 2023-01-02 MED ORDER — APIXABAN 2.5 MG PO TABS: 2.5000 mg | ORAL_TABLET | Freq: Two times a day (BID) | ORAL | 3 refills | Status: DC

## 2023-01-02 NOTE — Progress Notes (Signed)
[facility] [phone]   Fax:([phone]  PROGRESS NOTE  Patient Care Team: Merri Brunette, MD as PCP - General (Internal Medicine)  Hematological/Oncological History # Unprovoked Bilateral Pulmonary Emboli 04/18/2021: CT Angio chest showed bilateral segmental pulmonary emboli with CT evidence of right heart strain (RV/LV Ratio = 1.2) consistent with at least submassive (intermediate risk) PE.  Initially started on heparin therapy but transitioned to Eliquis at time of discharge 07/05/2021: Establish care with Dr. Leonides Schanz 01/03/2022: Transition to maintenance dose 2.5 mg twice daily Eliquis  Interval History:  Paul Roberts 77 y.o. male with medical history significant for unprovoked bilateral pulmonary emboli who presents for a follow up visit. The patient's last visit was on 07/04/2022. In the interim since the last visit he has continued on eliquis therapy.   On exam Dr. Karn Cassis reports he ran out of his Eliquis prescription last week and did not have any refills at his pharmacy. He denies any bruising or bleeding from Eliquis. He is otherwise feeling well. He is again concerning that the blood thinner is negatively affecting his circulation. We discussed the rationale for indefinite anticoagulation and need to continue on Eliquis therapy. At this time, he is willing and able to proceed with the Eliquis therapy at this time.  He reports no fevers, chills, sweats, nausea, vomiting or diarrhea.  A full 10 point ROS is listed below.  MEDICAL HISTORY:  Past Medical History:  Diagnosis Date   Diabetes mellitus without complication (HCC)    Hypertension     SURGICAL HISTORY: Past Surgical History:  Procedure Laterality Date   NO PAST SURGERIES      SOCIAL HISTORY: Social History   Socioeconomic History   Marital status: Widowed    Spouse name: Not on file   Number of children: Not on file   Years of education: Not on file   Highest education level:  Not on file  Occupational History   Not on file  Tobacco Use   Smoking status: Former   Smokeless tobacco: Never  Vaping Use   Vaping status: Never Used  Substance and Sexual Activity   Alcohol use: No   Drug use: No   Sexual activity: Not Currently  Other Topics Concern   Not on file  Social History Narrative   Right handed   Left health not well   Social Determinants of Health   Financial Resource Strain: Not on file  Food Insecurity: Not on file  Transportation Needs: Not on file  Physical Activity: Not on file  Stress: Not on file  Social Connections: Not on file  Intimate Partner Violence: Not on file    FAMILY HISTORY: No family history on file.  ALLERGIES:  is allergic to ciprofloxacin, rosuvastatin, and sulfa antibiotics.  MEDICATIONS:  Current Outpatient Medications  Medication Sig Dispense Refill   amLODipine (NORVASC) 5 MG tablet Take 5 mg by mouth daily.     apixaban (ELIQUIS) 2.5 MG TABS tablet Take 1 tablet (2.5 mg total) by mouth 2 (two) times daily. 180 tablet 3   atorvastatin (LIPITOR) 20 MG tablet Take 20 mg by mouth daily.     glimepiride (AMARYL) 1 MG tablet Take 1 mg by mouth daily with breakfast.     Glycerin-Hypromellose-PEG 400 (DRY EYE RELIEF DROPS OP) Apply 1-2 drops to eye as needed (dryness).     metFORMIN (GLUCOPHAGE-XR) 500 MG 24 hr tablet Take 500 mg by mouth 2 (two) times daily.  2   Multiple Vitamin (MULTI-VITAMIN)  tablet Take 1 tablet by mouth daily.     NOVOLIN 70/30 KWIKPEN (70-30) 100 UNIT/ML KwikPen Inject 8 Units into the skin 2 (two) times daily.     tamsulosin (FLOMAX) 0.4 MG CAPS capsule Take 0.8 mg by mouth daily.     valsartan-hydrochlorothiazide (DIOVAN-HCT) 160-25 MG per tablet Take 1 tablet by mouth at bedtime.      No current facility-administered medications for this visit.    REVIEW OF SYSTEMS:   Constitutional: ( - ) fevers, ( - )  chills , ( - ) night sweats Eyes: ( - ) blurriness of vision, ( - ) double vision,  ( - ) watery eyes Ears, nose, mouth, throat, and face: ( - ) mucositis, ( - ) sore throat Respiratory: ( - ) cough, ( - ) dyspnea, ( - ) wheezes Cardiovascular: ( - ) palpitation, ( - ) chest discomfort, ( - ) lower extremity swelling Gastrointestinal:  ( - ) nausea, ( - ) heartburn, ( - ) change in bowel habits Skin: ( - ) abnormal skin rashes Lymphatics: ( - ) new lymphadenopathy, ( - ) easy bruising Neurological: ( - ) numbness, ( - ) tingling, ( - ) new weaknesses Behavioral/Psych: ( - ) mood change, ( - ) new changes  All other systems were reviewed with the patient and are negative.  PHYSICAL EXAMINATION:  Vitals:   01/02/23 1341  BP: 127/69  Pulse: 82  Resp: 16  Temp: 97.9 F (36.6 C)  SpO2: 100%   Filed Weights   01/02/23 1341  Weight: 172 lb 12.8 oz (78.4 kg)    GENERAL: well appearing elderly male, alert, no distress and comfortable SKIN: skin color, texture, turgor are normal, no rashes or significant lesions EYES: conjunctiva are pink and non-injected, sclera clear LUNGS: clear to auscultation and percussion with normal breathing effort HEART: regular rate & rhythm and no murmurs and no lower extremity edema Musculoskeletal: no cyanosis of digits and no clubbing  PSYCH: alert & oriented x 3, fluent speech NEURO: no focal motor/sensory deficits  LABORATORY DATA:  I have reviewed the data as listed    Latest Ref Rng & Units 01/02/2023   10:56 AM 12/09/2022    4:17 AM 12/08/2022    2:11 PM  CBC  WBC 4.0 - 10.5 K/uL 5.6  4.5    Hemoglobin 13.0 - 17.0 g/dL 13.0  86.5  78.4   Hematocrit 39.0 - 52.0 % 37.1  34.9  31.0   Platelets 150 - 400 K/uL 213  172         Latest Ref Rng & Units 01/02/2023   10:56 AM 12/09/2022    4:17 AM 12/08/2022    2:11 PM  CMP  Glucose 70 - 99 mg/dL 696  295    BUN 8 - 23 mg/dL 11  10    Creatinine 2.84 - 1.24 mg/dL 1.32  4.40    Sodium 102 - 145 mmol/L 139  135  135   Potassium 3.5 - 5.1 mmol/L 3.6  3.4  3.9   Chloride 98 - 111  mmol/L 100  100    CO2 22 - 32 mmol/L 29  24    Calcium 8.9 - 10.3 mg/dL 9.2  8.1    Total Protein 6.5 - 8.1 g/dL 7.1  6.5    Total Bilirubin 0.3 - 1.2 mg/dL 0.5  0.9    Alkaline Phos 38 - 126 U/L 47  44    AST 15 - 41 U/L 15  27    ALT 0 - 44 U/L 14  26      RADIOGRAPHIC STUDIES: CT Chest Wo Contrast  Result Date: 12/08/2022 CLINICAL DATA:  Respiratory illness, fever, cough, excessive sweating EXAM: CT CHEST WITHOUT CONTRAST TECHNIQUE: Multidetector CT imaging of the chest was performed following the standard protocol without IV contrast. RADIATION DOSE REDUCTION: This exam was performed according to the departmental dose-optimization program which includes automated exposure control, adjustment of the mA and/or kV according to patient size and/or use of iterative reconstruction technique. COMPARISON:  04/18/2021 FINDINGS: Cardiovascular: Aortic atherosclerosis. Normal heart size. Left coronary artery calcifications. No pericardial effusion. Mediastinum/Nodes: No enlarged mediastinal, hilar, or axillary lymph nodes. Small, benign calcified granulomatous mediastinal and bilateral hilar lymph nodes. Thyroid gland, trachea, and esophagus demonstrate no significant findings. Lungs/Pleura: Diffuse bilateral bronchial wall thickening. Benign calcified granulomatous nodule of the medial right lower lobe, requiring no further follow-up or characterization (series 4, image 80). Background of fine centrilobular nodularity concentrated in the lung apices. Dependent bibasilar scarring or partial atelectasis. No pleural effusion or pneumothorax. Upper Abdomen: No acute abnormality.  Hepatic steatosis Musculoskeletal: No chest wall abnormality. No acute osseous findings. IMPRESSION: 1. Diffuse bilateral bronchial wall thickening and background of fine centrilobular nodularity concentrated in the lung apices, consistent with bronchitis and respiratory bronchiolitis. These findings are most commonly seen in  smoking-related respiratory bronchiolitis although generally nonspecific and infectious or inflammatory. No other airspace opacity. 2. Coronary artery disease. 3. Hepatic steatosis. Aortic Atherosclerosis (ICD10-I70.0). Electronically Signed   By: Jearld Lesch M.D.   On: 12/08/2022 12:35   DG Chest 2 View  Result Date: 12/08/2022 CLINICAL DATA:  Cough EXAM: CHEST - 2 VIEW COMPARISON:  04/18/2021 FINDINGS: Transverse diameter of heart is slightly increased. There are no signs of pulmonary edema or focal pulmonary consolidation. There is no pleural effusion or pneumothorax. IMPRESSION: No active cardiopulmonary disease. Electronically Signed   By: Ernie Avena M.D.   On: 12/08/2022 10:53    ASSESSMENT & PLAN Perseus BRALLAN DRATH is a 77 y.o. male with medical history significant for unprovoked bilateral pulmonary emboli who presents for a follow up visit.    #Bilateral Unprovoked Pulmonary Emboli --findings at this time are consistent with a unprovoked VTE  --will order baseline CMP and CBC to assure labs are adequate for DOAC therapy  --ruled out APS with anticardiolipin and anti beta2 glycoprotein antibodies.  Lupus anticoagulant panel would be altered by presence of blood thinner, will hold on this testing.    --patient denies any bleeding, bruising, or dark stools on this medication. It is well tolerated. No difficulties accessing/affording the medication  Plan:  --Labs today show white blood cell count 5.6, hemoglobin 12.2, MCV 83.2, and platelets of 213 --recommend the patient continue eliquis 2.5 mg twice daily. Refill sent today. --He reports no signs or symptoms concerning for recurrent VTE such as chest pain, shortness of breath, leg swelling, or leg pain. --RTC in 6 months' time with strict return precautions for overt signs of bleeding.    # Hyperglycemia -- Improved from prior, glucose is 275 today. -- Continue to follow with primary care provider regarding sugar control.  No  orders of the defined types were placed in this encounter.   All questions were answered. The patient knows to call the clinic with any problems, questions or concerns.  I have spent a total of 25 minutes minutes of face-to-face and non-face-to-face time, preparing to see the patient, performing a medically appropriate examination, counseling and  educating the patient, documenting clinical information in the electronic health record,  and care coordination.   Georga Kaufmann PA-C Dept of Hematology and Oncology [facility] Phone: [phone]   01/02/2023 2:53 PM

## 2023-01-04 ENCOUNTER — Telehealth: Payer: Self-pay | Admitting: Physician Assistant

## 2023-01-04 NOTE — Telephone Encounter (Signed)
 Left patient a message regarding upcoming appointment times/dates

## 2023-01-25 DIAGNOSIS — Z23 Encounter for immunization: Secondary | ICD-10-CM | POA: Diagnosis not present

## 2023-03-09 DIAGNOSIS — I10 Essential (primary) hypertension: Secondary | ICD-10-CM | POA: Diagnosis not present

## 2023-03-09 DIAGNOSIS — E785 Hyperlipidemia, unspecified: Secondary | ICD-10-CM | POA: Diagnosis not present

## 2023-03-09 DIAGNOSIS — M1990 Unspecified osteoarthritis, unspecified site: Secondary | ICD-10-CM | POA: Diagnosis not present

## 2023-03-09 DIAGNOSIS — N400 Enlarged prostate without lower urinary tract symptoms: Secondary | ICD-10-CM | POA: Diagnosis not present

## 2023-03-14 DIAGNOSIS — K080 Exfoliation of teeth due to systemic causes: Secondary | ICD-10-CM | POA: Diagnosis not present

## 2023-03-20 ENCOUNTER — Telehealth: Payer: Self-pay | Admitting: *Deleted

## 2023-03-20 ENCOUNTER — Encounter: Payer: Self-pay | Admitting: *Deleted

## 2023-03-20 NOTE — Telephone Encounter (Signed)
TCT patient regarding his need for a tooth extraction but is on Eliquis. Advised that he should stop his Eliquis 2 days prior to dental appt and then can resume the Eliquis after the procedure as long as there is no active bleeding. Advised that the clearance information from Dr. Leonides Schanz has been fax'd to the oral surgeon's office. Pt voice understanding and appreciation.

## 2023-04-01 DIAGNOSIS — E785 Hyperlipidemia, unspecified: Secondary | ICD-10-CM | POA: Diagnosis not present

## 2023-04-01 DIAGNOSIS — E118 Type 2 diabetes mellitus with unspecified complications: Secondary | ICD-10-CM | POA: Diagnosis not present

## 2023-04-01 DIAGNOSIS — I10 Essential (primary) hypertension: Secondary | ICD-10-CM | POA: Diagnosis not present

## 2023-04-01 DIAGNOSIS — Z8639 Personal history of other endocrine, nutritional and metabolic disease: Secondary | ICD-10-CM | POA: Diagnosis not present

## 2023-04-12 ENCOUNTER — Encounter (INDEPENDENT_AMBULATORY_CARE_PROVIDER_SITE_OTHER): Payer: Medicare Other | Admitting: Ophthalmology

## 2023-04-12 DIAGNOSIS — H43813 Vitreous degeneration, bilateral: Secondary | ICD-10-CM

## 2023-04-12 DIAGNOSIS — H35033 Hypertensive retinopathy, bilateral: Secondary | ICD-10-CM

## 2023-04-12 DIAGNOSIS — I10 Essential (primary) hypertension: Secondary | ICD-10-CM

## 2023-04-12 DIAGNOSIS — H3561 Retinal hemorrhage, right eye: Secondary | ICD-10-CM | POA: Diagnosis not present

## 2023-04-12 DIAGNOSIS — H33303 Unspecified retinal break, bilateral: Secondary | ICD-10-CM

## 2023-04-12 DIAGNOSIS — H2513 Age-related nuclear cataract, bilateral: Secondary | ICD-10-CM

## 2023-04-17 DIAGNOSIS — K080 Exfoliation of teeth due to systemic causes: Secondary | ICD-10-CM | POA: Diagnosis not present

## 2023-04-26 DIAGNOSIS — K080 Exfoliation of teeth due to systemic causes: Secondary | ICD-10-CM | POA: Diagnosis not present

## 2023-05-24 ENCOUNTER — Telehealth: Payer: Self-pay | Admitting: *Deleted

## 2023-05-24 ENCOUNTER — Encounter: Payer: Self-pay | Admitting: *Deleted

## 2023-05-24 ENCOUNTER — Other Ambulatory Visit: Payer: Self-pay | Admitting: *Deleted

## 2023-05-24 MED ORDER — APIXABAN 2.5 MG PO TABS: 2.5000 mg | ORAL_TABLET | Freq: Two times a day (BID) | ORAL | 3 refills | Status: AC

## 2023-05-24 NOTE — Telephone Encounter (Signed)
Received message from on call service that pt called last evening regarding his Eliquis. Apparently he had stopped it for dental work and did not know if he should restart it or. Per Dr.Dorsey's notes pt is to be on Eliquis indefinitely as he PE's were unprovoked. Attempted call back to pt. No answer and his vm is full so unable to leave vm message for him to resume his Eliquis. Will send MyChart message.

## 2023-06-04 DIAGNOSIS — H524 Presbyopia: Secondary | ICD-10-CM | POA: Diagnosis not present

## 2023-06-04 DIAGNOSIS — H3561 Retinal hemorrhage, right eye: Secondary | ICD-10-CM | POA: Diagnosis not present

## 2023-06-04 DIAGNOSIS — H35033 Hypertensive retinopathy, bilateral: Secondary | ICD-10-CM | POA: Diagnosis not present

## 2023-06-04 DIAGNOSIS — H11001 Unspecified pterygium of right eye: Secondary | ICD-10-CM | POA: Diagnosis not present

## 2023-06-04 DIAGNOSIS — E119 Type 2 diabetes mellitus without complications: Secondary | ICD-10-CM | POA: Diagnosis not present

## 2023-06-04 DIAGNOSIS — H25813 Combined forms of age-related cataract, bilateral: Secondary | ICD-10-CM | POA: Diagnosis not present

## 2023-06-04 DIAGNOSIS — H52213 Irregular astigmatism, bilateral: Secondary | ICD-10-CM | POA: Diagnosis not present

## 2023-07-01 ENCOUNTER — Inpatient Hospital Stay (HOSPITAL_BASED_OUTPATIENT_CLINIC_OR_DEPARTMENT_OTHER): Payer: Medicare Other | Admitting: Hematology and Oncology

## 2023-07-01 ENCOUNTER — Inpatient Hospital Stay: Payer: Medicare Other | Attending: Hematology and Oncology

## 2023-07-01 ENCOUNTER — Other Ambulatory Visit: Payer: Self-pay | Admitting: Hematology and Oncology

## 2023-07-01 ENCOUNTER — Other Ambulatory Visit: Payer: Self-pay

## 2023-07-01 VITALS — BP 157/78 | HR 69 | Temp 96.6°F | Resp 13 | Wt 177.5 lb

## 2023-07-01 DIAGNOSIS — Z86711 Personal history of pulmonary embolism: Secondary | ICD-10-CM | POA: Insufficient documentation

## 2023-07-01 DIAGNOSIS — I2694 Multiple subsegmental pulmonary emboli without acute cor pulmonale: Secondary | ICD-10-CM

## 2023-07-01 DIAGNOSIS — Z7984 Long term (current) use of oral hypoglycemic drugs: Secondary | ICD-10-CM | POA: Insufficient documentation

## 2023-07-01 DIAGNOSIS — E1165 Type 2 diabetes mellitus with hyperglycemia: Secondary | ICD-10-CM | POA: Diagnosis not present

## 2023-07-01 DIAGNOSIS — E039 Hypothyroidism, unspecified: Secondary | ICD-10-CM | POA: Diagnosis not present

## 2023-07-01 DIAGNOSIS — R739 Hyperglycemia, unspecified: Secondary | ICD-10-CM

## 2023-07-01 LAB — CBC WITH DIFFERENTIAL (CANCER CENTER ONLY)
Abs Immature Granulocytes: 0.01 10*3/uL (ref 0.00–0.07)
Basophils Absolute: 0.1 10*3/uL (ref 0.0–0.1)
Basophils Relative: 1 %
Eosinophils Absolute: 0.2 10*3/uL (ref 0.0–0.5)
Eosinophils Relative: 4 %
HCT: 37.7 % — ABNORMAL LOW (ref 39.0–52.0)
Hemoglobin: 12.7 g/dL — ABNORMAL LOW (ref 13.0–17.0)
Immature Granulocytes: 0 %
Lymphocytes Relative: 40 %
Lymphs Abs: 2.4 10*3/uL (ref 0.7–4.0)
MCH: 27.1 pg (ref 26.0–34.0)
MCHC: 33.7 g/dL (ref 30.0–36.0)
MCV: 80.6 fL (ref 80.0–100.0)
Monocytes Absolute: 0.5 10*3/uL (ref 0.1–1.0)
Monocytes Relative: 8 %
Neutro Abs: 2.9 10*3/uL (ref 1.7–7.7)
Neutrophils Relative %: 47 %
Platelet Count: 212 10*3/uL (ref 150–400)
RBC: 4.68 MIL/uL (ref 4.22–5.81)
RDW: 13.8 % (ref 11.5–15.5)
WBC Count: 6.1 10*3/uL (ref 4.0–10.5)
nRBC: 0 % (ref 0.0–0.2)

## 2023-07-01 LAB — CMP (CANCER CENTER ONLY)
ALT: 17 U/L (ref 0–44)
AST: 16 U/L (ref 15–41)
Albumin: 4.4 g/dL (ref 3.5–5.0)
Alkaline Phosphatase: 48 U/L (ref 38–126)
Anion gap: 8 (ref 5–15)
BUN: 11 mg/dL (ref 8–23)
CO2: 29 mmol/L (ref 22–32)
Calcium: 9.5 mg/dL (ref 8.9–10.3)
Chloride: 102 mmol/L (ref 98–111)
Creatinine: 1.13 mg/dL (ref 0.61–1.24)
GFR, Estimated: >60 mL/min (ref >60)
Glucose, Bld: 164 mg/dL — ABNORMAL HIGH (ref 70–99)
Potassium: 3.5 mmol/L (ref 3.5–5.1)
Sodium: 139 mmol/L (ref 135–145)
Total Bilirubin: 0.5 mg/dL (ref 0.0–1.2)
Total Protein: 7.3 g/dL (ref 6.5–8.1)

## 2023-07-01 NOTE — Progress Notes (Signed)
 [facility] Telephone:(336) [phone]   Fax:(336) [phone]  PROGRESS NOTE  Patient Care Team: Merri Brunette, MD as PCP - General (Internal Medicine)  Hematological/Oncological History # Unprovoked Bilateral Pulmonary Emboli 04/18/2021: CT Angio chest showed bilateral segmental pulmonary emboli with CT evidence of right heart strain (RV/LV Ratio = 1.2) consistent with at least submassive (intermediate risk) PE.  Initially started on heparin therapy but transitioned to Eliquis at time of discharge 07/05/2021: Establish care with Dr. Leonides Schanz 01/03/2022: Transition to maintenance dose 2.5 mg twice daily Eliquis  Interval History:  Paul Roberts 79 y.o. male with medical history significant for unprovoked bilateral pulmonary emboli who presents for a follow up visit. The patient's last visit was on 01/02/2023. In the interim since the last visit he has discontinued his eliquis therapy.   On exam Dr. Karn Cassis reports he has been all right in the interim since her last visit.  He reports he stopped his Eliquis therapy after his last dental procedure and did not restart it.  He reports it was not causing any symptoms or side effects and he was not having any bleeding, bruising, or dark stools.  He reports he stopped it because he "disliked it".  He notes that because the medication was okay and that there were no other particular reasons why he wanted to discontinue the medicine.  Discussed extensively the risks and benefits of the Eliquis therapy and he notes that he wants to discontinue this therapy.. At this time, he is willing and able to proceed with the Eliquis therapy at this time.  He reports no fevers, chills, sweats, nausea, vomiting or diarrhea.  A full 10 point ROS is listed below.  MEDICAL HISTORY:  Past Medical History:  Diagnosis Date   Diabetes mellitus without complication (HCC)    Hypertension     SURGICAL HISTORY: Past Surgical History:  Procedure Laterality Date    NO PAST SURGERIES      SOCIAL HISTORY: Social History   Socioeconomic History   Marital status: Widowed    Spouse name: Not on file   Number of children: Not on file   Years of education: Not on file   Highest education level: Not on file  Occupational History   Not on file  Tobacco Use   Smoking status: Former   Smokeless tobacco: Never  Vaping Use   Vaping status: Never Used  Substance and Sexual Activity   Alcohol use: No   Drug use: No   Sexual activity: Not Currently  Other Topics Concern   Not on file  Social History Narrative   Right handed   Left health not well   Social Drivers of Health   Financial Resource Strain: Not on file  Food Insecurity: Not on file  Transportation Needs: Not on file  Physical Activity: Not on file  Stress: Not on file  Social Connections: Not on file  Intimate Partner Violence: Not on file    FAMILY HISTORY: No family history on file.  ALLERGIES:  is allergic to ciprofloxacin, rosuvastatin, and sulfa antibiotics.  MEDICATIONS:  Current Outpatient Medications  Medication Sig Dispense Refill   amLODipine (NORVASC) 5 MG tablet Take 5 mg by mouth daily.     apixaban (ELIQUIS) 2.5 MG TABS tablet Take 1 tablet (2.5 mg total) by mouth 2 (two) times daily. 180 tablet 3   atorvastatin (LIPITOR) 20 MG tablet Take 20 mg by mouth daily.     glimepiride (AMARYL) 1 MG tablet Take 1 mg by  mouth daily with breakfast.     Glycerin-Hypromellose-PEG 400 (DRY EYE RELIEF DROPS OP) Apply 1-2 drops to eye as needed (dryness).     metFORMIN (GLUCOPHAGE-XR) 500 MG 24 hr tablet Take 500 mg by mouth 2 (two) times daily.  2   Multiple Vitamin (MULTI-VITAMIN) tablet Take 1 tablet by mouth daily.     NOVOLIN 70/30 KWIKPEN (70-30) 100 UNIT/ML KwikPen Inject 8 Units into the skin 2 (two) times daily.     tamsulosin (FLOMAX) 0.4 MG CAPS capsule Take 0.8 mg by mouth daily.     valsartan-hydrochlorothiazide (DIOVAN-HCT) 160-25 MG per tablet Take 1 tablet by  mouth at bedtime.      No current facility-administered medications for this visit.    REVIEW OF SYSTEMS:   Constitutional: ( - ) fevers, ( - )  chills , ( - ) night sweats Eyes: ( - ) blurriness of vision, ( - ) double vision, ( - ) watery eyes Ears, nose, mouth, throat, and face: ( - ) mucositis, ( - ) sore throat Respiratory: ( - ) cough, ( - ) dyspnea, ( - ) wheezes Cardiovascular: ( - ) palpitation, ( - ) chest discomfort, ( - ) lower extremity swelling Gastrointestinal:  ( - ) nausea, ( - ) heartburn, ( - ) change in bowel habits Skin: ( - ) abnormal skin rashes Lymphatics: ( - ) new lymphadenopathy, ( - ) easy bruising Neurological: ( - ) numbness, ( - ) tingling, ( - ) new weaknesses Behavioral/Psych: ( - ) mood change, ( - ) new changes  All other systems were reviewed with the patient and are negative.  PHYSICAL EXAMINATION:  Vitals:   07/01/23 1539  BP: (!) 157/78  Pulse: 69  Resp: 13  Temp: (!) 96.6 F (35.9 C)  SpO2: 100%    Filed Weights   07/01/23 1539  Weight: 177 lb 8 oz (80.5 kg)     GENERAL: well appearing elderly male, alert, no distress and comfortable SKIN: skin color, texture, turgor are normal, no rashes or significant lesions EYES: conjunctiva are pink and non-injected, sclera clear LUNGS: clear to auscultation and percussion with normal breathing effort HEART: regular rate & rhythm and no murmurs and no lower extremity edema Musculoskeletal: no cyanosis of digits and no clubbing  PSYCH: alert & oriented x 3, fluent speech NEURO: no focal motor/sensory deficits  LABORATORY DATA:  I have reviewed the data as listed    Latest Ref Rng & Units 07/01/2023    2:48 PM 01/02/2023   10:56 AM 12/09/2022    4:17 AM  CBC  WBC 4.0 - 10.5 K/uL 6.1  5.6  4.5   Hemoglobin 13.0 - 17.0 g/dL 40.9  81.1  91.4   Hematocrit 39.0 - 52.0 % 37.7  37.1  34.9   Platelets 150 - 400 K/uL 212  213  172        Latest Ref Rng & Units 07/01/2023    2:48 PM 01/02/2023    10:56 AM 12/09/2022    4:17 AM  CMP  Glucose 70 - 99 mg/dL 782  956  213   BUN 8 - 23 mg/dL 11  11  10    Creatinine 0.61 - 1.24 mg/dL 0.86  5.78  4.69   Sodium 135 - 145 mmol/L 139  139  135   Potassium 3.5 - 5.1 mmol/L 3.5  3.6  3.4   Chloride 98 - 111 mmol/L 102  100  100   CO2 22 - 32  mmol/L 29  29  24    Calcium 8.9 - 10.3 mg/dL 9.5  9.2  8.1   Total Protein 6.5 - 8.1 g/dL 7.3  7.1  6.5   Total Bilirubin 0.0 - 1.2 mg/dL 0.5  0.5  0.9   Alkaline Phos 38 - 126 U/L 48  47  44   AST 15 - 41 U/L 16  15  27    ALT 0 - 44 U/L 17  14  26      RADIOGRAPHIC STUDIES: No results found.  ASSESSMENT & PLAN Paul Roberts is a 78 y.o. male with medical history significant for unprovoked bilateral pulmonary emboli who presents for a follow up visit.    #Bilateral Unprovoked Pulmonary Emboli --findings at this time are consistent with a unprovoked VTE  --will order baseline CMP and CBC to assure labs are adequate for DOAC therapy  --ruled out APS with anticardiolipin and anti beta2 glycoprotein antibodies.  Lupus anticoagulant panel would be altered by presence of blood thinner, will hold on this testing.    --patient denies any bleeding, bruising, or dark stools on this medication. It is well tolerated. No difficulties accessing/affording the medication  Plan:  --Labs today show white blood cell count 6.1, Hgb 12.7, MCV 80.6, Plt 212 --recommend the patient continue eliquis 2.5 mg twice daily.  He reports that he is no longer wishes to take his Eliquis and stopped taking it after his last dental procedure. --We discussed the risks of not taking his Eliquis therapy and he voices understanding.  He noted he would like to proceed without this therapy. --He reports no signs or symptoms concerning for recurrent VTE such as chest pain, shortness of breath, leg swelling, or leg pain. --RTC if he were to want to restart his eliquis.   # Hyperglycemia -- Improved from prior, glucose is 164 today. --  Continue to follow with primary care provider regarding sugar control.  No orders of the defined types were placed in this encounter.   All questions were answered. The patient knows to call the clinic with any problems, questions or concerns.  I have spent a total of 25 minutes minutes of face-to-face and non-face-to-face time, preparing to see the patient, performing a medically appropriate examination, counseling and educating the patient, documenting clinical information in the electronic health record,  and care coordination.   Ulysees Barns, MD Department of Hematology/Oncology [facility] Phone: [phone] Pager: [phone] Email: [email or web address]    07/01/2023 4:18 PM

## 2023-07-08 DIAGNOSIS — Z8639 Personal history of other endocrine, nutritional and metabolic disease: Secondary | ICD-10-CM | POA: Diagnosis not present

## 2023-07-08 DIAGNOSIS — I10 Essential (primary) hypertension: Secondary | ICD-10-CM | POA: Diagnosis not present

## 2023-07-08 DIAGNOSIS — E118 Type 2 diabetes mellitus with unspecified complications: Secondary | ICD-10-CM | POA: Diagnosis not present

## 2023-07-08 DIAGNOSIS — E785 Hyperlipidemia, unspecified: Secondary | ICD-10-CM | POA: Diagnosis not present

## 2023-08-27 DIAGNOSIS — K080 Exfoliation of teeth due to systemic causes: Secondary | ICD-10-CM | POA: Diagnosis not present

## 2023-09-11 DIAGNOSIS — K080 Exfoliation of teeth due to systemic causes: Secondary | ICD-10-CM | POA: Diagnosis not present

## 2023-09-13 DIAGNOSIS — L299 Pruritus, unspecified: Secondary | ICD-10-CM | POA: Diagnosis not present

## 2023-10-05 ENCOUNTER — Other Ambulatory Visit: Payer: Self-pay

## 2023-10-05 ENCOUNTER — Encounter (HOSPITAL_COMMUNITY): Payer: Self-pay | Admitting: *Deleted

## 2023-10-05 ENCOUNTER — Emergency Department (HOSPITAL_COMMUNITY)
Admission: EM | Admit: 2023-10-05 | Discharge: 2023-10-06 | Disposition: A | Discharge status: Home / Self Care | Attending: Emergency Medicine | Admitting: Emergency Medicine

## 2023-10-05 DIAGNOSIS — Z79899 Other long term (current) drug therapy: Secondary | ICD-10-CM | POA: Diagnosis not present

## 2023-10-05 DIAGNOSIS — E119 Type 2 diabetes mellitus without complications: Secondary | ICD-10-CM | POA: Insufficient documentation

## 2023-10-05 DIAGNOSIS — I10 Essential (primary) hypertension: Secondary | ICD-10-CM | POA: Diagnosis not present

## 2023-10-05 DIAGNOSIS — Z7901 Long term (current) use of anticoagulants: Secondary | ICD-10-CM | POA: Diagnosis not present

## 2023-10-05 DIAGNOSIS — Z7984 Long term (current) use of oral hypoglycemic drugs: Secondary | ICD-10-CM | POA: Insufficient documentation

## 2023-10-05 NOTE — ED Triage Notes (Signed)
 The pt came in tonight with high bp  he takes meds for bp and is taking  it as normal  his hear rate was 44 on arrival to triage and his rhy is irregular

## 2023-10-05 NOTE — ED Triage Notes (Signed)
The pt is not having any chest pain

## 2023-10-06 LAB — CBC
HCT: 42.1 % (ref 39.0–52.0)
Hemoglobin: 13.7 g/dL (ref 13.0–17.0)
MCH: 27.4 pg (ref 26.0–34.0)
MCHC: 32.5 g/dL (ref 30.0–36.0)
MCV: 84.2 fL (ref 80.0–100.0)
Platelets: 241 10*3/uL (ref 150–400)
RBC: 5 MIL/uL (ref 4.22–5.81)
RDW: 13.7 % (ref 11.5–15.5)
WBC: 7.3 10*3/uL (ref 4.0–10.5)
nRBC: 0 % (ref 0.0–0.2)

## 2023-10-06 LAB — COMPREHENSIVE METABOLIC PANEL WITH GFR
ALT: 26 U/L (ref 0–44)
AST: 21 U/L (ref 15–41)
Albumin: 3.9 g/dL (ref 3.5–5.0)
Alkaline Phosphatase: 51 U/L (ref 38–126)
Anion gap: 13 (ref 5–15)
BUN: 10 mg/dL (ref 8–23)
CO2: 26 mmol/L (ref 22–32)
Calcium: 9.3 mg/dL (ref 8.9–10.3)
Chloride: 98 mmol/L (ref 98–111)
Creatinine, Ser: 1.17 mg/dL (ref 0.61–1.24)
GFR, Estimated: >60 mL/min (ref >60)
Glucose, Bld: 349 mg/dL — ABNORMAL HIGH (ref 70–99)
Potassium: 3.6 mmol/L (ref 3.5–5.1)
Sodium: 137 mmol/L (ref 135–145)
Total Bilirubin: 0.6 mg/dL (ref 0.0–1.2)
Total Protein: 7.4 g/dL (ref 6.5–8.1)

## 2023-10-06 LAB — URINALYSIS, ROUTINE W REFLEX MICROSCOPIC
Bacteria, UA: NONE SEEN
Bilirubin Urine: NEGATIVE
Glucose, UA: >=500 mg/dL — AB
Hgb urine dipstick: NEGATIVE
Ketones, ur: NEGATIVE mg/dL
Leukocytes,Ua: NEGATIVE
Nitrite: NEGATIVE
Protein, ur: 30 mg/dL — AB
Specific Gravity, Urine: 1.025 (ref 1.005–1.030)
pH: 5 (ref 5.0–8.0)

## 2023-10-06 LAB — LIPASE, BLOOD: Lipase: 43 U/L (ref 11–51)

## 2023-10-06 LAB — TROPONIN I (HIGH SENSITIVITY)
Troponin I (High Sensitivity): 12 ng/L (ref <18)
Troponin I (High Sensitivity): 12 ng/L (ref <18)

## 2023-10-06 NOTE — ED Provider Triage Note (Signed)
 Emergency Medicine Provider Triage Evaluation Note  Paul Roberts , a 78 y.o. male  was evaluated in triage.  Pt complains of high blood pressure.  Reports he checks blood pressure every day and noted that tonight his blood pressure was elevated at 185/95.  He reports compliance on amlodipine, valsartan HCTZ.  Denies chest pain, shortness of breath, headache, blurred vision.  Review of Systems  Positive:  Negative:   Physical Exam  BP (!) 179/73 (BP Location: Right Arm)   Pulse (!) 44   Temp 98 F (36.7 C)   Resp 16   Ht 5\' 11"  (1.803 m)   Wt 80.5 kg   SpO2 98%   BMI 24.75 kg/m  Gen:   Awake, no distress   Resp:  Normal effort  MSK:   Moves extremities without difficulty  Other:  Normal neuroexam.  Medical Decision Making  Medically screening exam initiated at 1:28 AM.  Appropriate orders placed.  Paul Roberts was informed that the remainder of the evaluation will be completed by another provider, this initial triage assessment does not replace that evaluation, and the importance of remaining in the ED until their evaluation is complete.     Adel Aden, PA-C 10/06/23 0129

## 2023-10-06 NOTE — ED Provider Notes (Signed)
  EMERGENCY DEPARTMENT AT [facility] Provider Note   CSN: [id number] Arrival date & time: 10/05/23  2301     History  Chief Complaint  Patient presents with   Hypertension    Paul Roberts is a 78 y.o. male with history of diabetes and hypertension.  The patient presents to the ED for evaluation of hypertension.  He reports that this evening he checked his blood pressure at home and noted it was 185 systolic.  He reports compliance on home blood pressure medications to include amlodipine, valsartan hydrochlorothiazide.  He reports he checks his blood pressure about once a day and it is typically not this elevated.  He reports that earlier today he was walking in the garden for an extended period of time and he was slightly fatigued when he got done with this.  He denies any chest pain, shortness of breath, blurred vision or headache.  Denies any nausea or vomiting, lightheadedness, dizziness.   Hypertension       Home Medications Prior to Admission medications   Medication Sig Start Date End Date Taking? Authorizing Provider  amLODipine (NORVASC) 5 MG tablet Take 5 mg by mouth daily. 03/09/21   [provider]  apixaban  (ELIQUIS ) 2.5 MG TABS tablet Take 1 tablet (2.5 mg total) by mouth 2 (two) times daily. 05/24/23   Dorsey, John T IV, MD  atorvastatin (LIPITOR) 20 MG tablet Take 20 mg by mouth daily. 04/01/21   [provider]  glimepiride (AMARYL) 1 MG tablet Take 1 mg by mouth daily with breakfast.    [provider]  Glycerin -Hypromellose-PEG 400 (DRY EYE RELIEF DROPS OP) Apply 1-2 drops to eye as needed (dryness).    [provider]  metFORMIN (GLUCOPHAGE-XR) 500 MG 24 hr tablet Take 500 mg by mouth 2 (two) times daily. 09/27/17   [provider]  Multiple Vitamin (MULTI-VITAMIN) tablet Take 1 tablet by mouth daily.    [provider]  NOVOLIN 70/30 KWIKPEN (70-30) 100 UNIT/ML KwikPen Inject 8 Units into  the skin 2 (two) times daily. 03/12/21   [provider]  tamsulosin  (FLOMAX ) 0.4 MG CAPS capsule Take 0.8 mg by mouth daily. 03/17/21   [provider]  valsartan-hydrochlorothiazide (DIOVAN-HCT) 160-25 MG per tablet Take 1 tablet by mouth at bedtime.     [provider]      Allergies    Ciprofloxacin , Rosuvastatin, and Sulfa antibiotics    Review of Systems   Review of Systems  All other systems reviewed and are negative.   Physical Exam Updated Vital Signs BP (!) 158/67 (BP Location: Right Arm)   Pulse (!) 44   Temp 98.4 F (36.9 C) (Oral)   Resp 19   Ht 5\' 11"  (1.803 m)   Wt 80.5 kg   SpO2 99%   BMI 24.75 kg/m  Physical Exam Vitals and nursing note reviewed.  Constitutional:      General: He is not in acute distress.    Appearance: He is well-developed.  HENT:     Head: Normocephalic and atraumatic.  Eyes:     Conjunctiva/sclera: Conjunctivae normal.  Cardiovascular:     Rate and Rhythm: Normal rate and regular rhythm.     Heart sounds: No murmur heard. Pulmonary:     Effort: Pulmonary effort is normal. No respiratory distress.     Breath sounds: Normal breath sounds.  Abdominal:     Palpations: Abdomen is soft.     Tenderness: There is no abdominal tenderness.  Musculoskeletal:        General: No swelling.     Cervical back: Neck supple.  Skin:    General: Skin is warm and dry.     Capillary Refill: Capillary refill takes less than 2 seconds.  Neurological:     Mental Status: He is alert and oriented to person, place, and time. Mental status is at baseline.     Comments: CN III through XII intact.  Intact finger-nose, heel-to-shin.  No pronator drift, no slurred speech or facial droop.  5 out of 5 strength bilateral upper extremities.  5 out of 5 strength bilateral lower extremities.  Tracks across midline.  Pupils PERRL.  Psychiatric:        Mood and Affect: Mood normal.     ED Results / Procedures / Treatments   Labs (all  labs ordered are listed, but only abnormal results are displayed) Labs Reviewed  COMPREHENSIVE METABOLIC PANEL WITH GFR - Abnormal; Notable for the following components:      Result Value   Glucose, Bld 349 (*)    All other components within normal limits  URINALYSIS, ROUTINE W REFLEX MICROSCOPIC - Abnormal; Notable for the following components:   Glucose, UA >=500 (*)    Protein, ur 30 (*)    All other components within normal limits  LIPASE, BLOOD  CBC  TROPONIN I (HIGH SENSITIVITY)  TROPONIN I (HIGH SENSITIVITY)    EKG EKG Interpretation Date/Time:  Saturday Oct 05 2023 23:53:55 EDT Ventricular Rate:  86 PR Interval:    QRS Duration:  142 QT Interval:  378 QTC Calculation: 452 R Axis:   34  Text Interpretation: Sinus rhythm with bigeminy Right bundle branch block T wave abnormality, consider lateral ischemia Abnormal ECG When compared with ECG of 18-Apr-2021 12:55, PREVIOUS ECG IS PRESENT Confirmed by Townsend Freud [phone]) on 10/06/2023 5:18:50 AM  Radiology No results found.  Procedures Procedures    Medications Ordered in ED Medications - No data to display  ED Course/ Medical Decision Making/ A&P  Medical Decision Making  78 year old male presents for evaluation.  Please see HPI for further details.  On examination the patient is afebrile and nontachycardic.  His lung sounds are clear bilaterally, he is not hypoxic.  Abdomen soft and compressible.  Neurological examination at baseline.  Patient labs initiated in triage by RN include CBC, CMP, lipase, urinalysis and troponin x 2.  EKG also collected.  Patient denies abdominal pain so unsure why abdominal pain labs are ordered.  CBC without leukocytosis or anemia.  Metabolic panel with glucose 349, anion gap 13 so doubt DKA.  No electrolyte derangement.  No elevated LFTs.  Urinalysis shows glucose, protein.  Lipase 43.  Troponin 12, 12 with delta.  EKG nonischemic.  Patient pulse rate initially 44 in the department  however on reevaluation of EKG, patient pulse rate is elevated 81.  Denies any symptoms.  At this time, patient will be discharged home.  Advised to continue taking antihypertensive medication.  Advised to follow-up with PCP.  He was given very strict return precautions.  He was advised that if he develops chest pain, shortness of breath, headache over vision to return to the ED.  He was encouraged to continue monitoring his blood pressure at home and recording it for his PCP.  He had all of his questions answered to his satisfaction.  He is stable to discharge home.   Final Clinical Impression(s) / ED Diagnoses Final diagnoses:  Asymptomatic hypertension    Rx /  DC Orders ED Discharge Orders     None         Kristin Peyer 10/06/23 0529    Lindle Rhea, MD 10/06/23 [id number]

## 2023-10-06 NOTE — Discharge Instructions (Signed)
 It was a pleasure taking part in your care.  As discussed, please follow-up with your PCP for further management of your blood pressure.  Please continue taking all blood pressure medications at home to include amlodipine and valsartan HCTZ.  Please record your blood pressure on this form attached and bring this to your PCP for evaluation.  If you develop chest pain, shortness of breath, headache or blurred vision please return to the ED for further management and care.

## 2023-10-09 DIAGNOSIS — I10 Essential (primary) hypertension: Secondary | ICD-10-CM | POA: Diagnosis not present

## 2023-10-09 DIAGNOSIS — F32A Depression, unspecified: Secondary | ICD-10-CM | POA: Diagnosis not present

## 2023-10-09 DIAGNOSIS — F4320 Adjustment disorder, unspecified: Secondary | ICD-10-CM | POA: Diagnosis not present

## 2023-10-09 DIAGNOSIS — F329 Major depressive disorder, single episode, unspecified: Secondary | ICD-10-CM | POA: Diagnosis not present

## 2023-10-28 DIAGNOSIS — K080 Exfoliation of teeth due to systemic causes: Secondary | ICD-10-CM | POA: Diagnosis not present

## 2023-11-12 DIAGNOSIS — K080 Exfoliation of teeth due to systemic causes: Secondary | ICD-10-CM | POA: Diagnosis not present

## 2023-11-14 DIAGNOSIS — I10 Essential (primary) hypertension: Secondary | ICD-10-CM | POA: Diagnosis not present

## 2023-11-14 DIAGNOSIS — E118 Type 2 diabetes mellitus with unspecified complications: Secondary | ICD-10-CM | POA: Diagnosis not present

## 2023-11-14 DIAGNOSIS — E785 Hyperlipidemia, unspecified: Secondary | ICD-10-CM | POA: Diagnosis not present

## 2023-11-14 DIAGNOSIS — Z8639 Personal history of other endocrine, nutritional and metabolic disease: Secondary | ICD-10-CM | POA: Diagnosis not present

## 2023-12-09 DIAGNOSIS — I2510 Atherosclerotic heart disease of native coronary artery without angina pectoris: Secondary | ICD-10-CM | POA: Diagnosis not present

## 2023-12-09 DIAGNOSIS — E785 Hyperlipidemia, unspecified: Secondary | ICD-10-CM | POA: Diagnosis not present

## 2023-12-09 DIAGNOSIS — E1165 Type 2 diabetes mellitus with hyperglycemia: Secondary | ICD-10-CM | POA: Diagnosis not present

## 2023-12-09 DIAGNOSIS — I10 Essential (primary) hypertension: Secondary | ICD-10-CM | POA: Diagnosis not present

## 2023-12-09 DIAGNOSIS — Z0000 Encounter for general adult medical examination without abnormal findings: Secondary | ICD-10-CM | POA: Diagnosis not present

## 2023-12-09 DIAGNOSIS — I2699 Other pulmonary embolism without acute cor pulmonale: Secondary | ICD-10-CM | POA: Diagnosis not present

## 2024-01-06 ENCOUNTER — Emergency Department (HOSPITAL_COMMUNITY)
Admission: EM | Admit: 2024-01-06 | Discharge: 2024-01-06 | Disposition: A | Discharge status: Home / Self Care | Attending: Emergency Medicine | Admitting: Emergency Medicine

## 2024-01-06 ENCOUNTER — Other Ambulatory Visit: Payer: Self-pay

## 2024-01-06 ENCOUNTER — Encounter (HOSPITAL_COMMUNITY): Payer: Self-pay | Admitting: Emergency Medicine

## 2024-01-06 ENCOUNTER — Emergency Department (HOSPITAL_COMMUNITY)

## 2024-01-06 DIAGNOSIS — R531 Weakness: Secondary | ICD-10-CM | POA: Diagnosis not present

## 2024-01-06 DIAGNOSIS — R29818 Other symptoms and signs involving the nervous system: Secondary | ICD-10-CM | POA: Diagnosis not present

## 2024-01-06 DIAGNOSIS — E1165 Type 2 diabetes mellitus with hyperglycemia: Secondary | ICD-10-CM | POA: Insufficient documentation

## 2024-01-06 DIAGNOSIS — R29898 Other symptoms and signs involving the musculoskeletal system: Secondary | ICD-10-CM

## 2024-01-06 DIAGNOSIS — R7982 Elevated C-reactive protein (CRP): Secondary | ICD-10-CM | POA: Insufficient documentation

## 2024-01-06 DIAGNOSIS — Z7901 Long term (current) use of anticoagulants: Secondary | ICD-10-CM | POA: Diagnosis not present

## 2024-01-06 DIAGNOSIS — Z79899 Other long term (current) drug therapy: Secondary | ICD-10-CM | POA: Diagnosis not present

## 2024-01-06 DIAGNOSIS — I10 Essential (primary) hypertension: Secondary | ICD-10-CM | POA: Insufficient documentation

## 2024-01-06 DIAGNOSIS — R5383 Other fatigue: Secondary | ICD-10-CM | POA: Insufficient documentation

## 2024-01-06 DIAGNOSIS — W19XXXA Unspecified fall, initial encounter: Secondary | ICD-10-CM

## 2024-01-06 DIAGNOSIS — Z7984 Long term (current) use of oral hypoglycemic drugs: Secondary | ICD-10-CM | POA: Insufficient documentation

## 2024-01-06 DIAGNOSIS — G9389 Other specified disorders of brain: Secondary | ICD-10-CM | POA: Insufficient documentation

## 2024-01-06 DIAGNOSIS — R55 Syncope and collapse: Secondary | ICD-10-CM | POA: Diagnosis not present

## 2024-01-06 DIAGNOSIS — M6281 Muscle weakness (generalized): Secondary | ICD-10-CM | POA: Diagnosis not present

## 2024-01-06 DIAGNOSIS — I491 Atrial premature depolarization: Secondary | ICD-10-CM | POA: Diagnosis not present

## 2024-01-06 LAB — CBG MONITORING, ED: Glucose-Capillary: 410 mg/dL — ABNORMAL HIGH (ref 70–99)

## 2024-01-06 LAB — URINALYSIS, ROUTINE W REFLEX MICROSCOPIC
Bacteria, UA: NONE SEEN
Bilirubin Urine: NEGATIVE
Glucose, UA: >=500 mg/dL — AB
Hgb urine dipstick: NEGATIVE
Ketones, ur: NEGATIVE mg/dL
Leukocytes,Ua: NEGATIVE
Nitrite: NEGATIVE
Protein, ur: NEGATIVE mg/dL
Specific Gravity, Urine: 1.02 (ref 1.005–1.030)
pH: 6 (ref 5.0–8.0)

## 2024-01-06 LAB — COMPREHENSIVE METABOLIC PANEL WITH GFR
ALT: 27 U/L (ref 0–44)
AST: 31 U/L (ref 15–41)
Albumin: 3.7 g/dL (ref 3.5–5.0)
Alkaline Phosphatase: 52 U/L (ref 38–126)
Anion gap: 15 (ref 5–15)
BUN: 11 mg/dL (ref 8–23)
CO2: 23 mmol/L (ref 22–32)
Calcium: 9 mg/dL (ref 8.9–10.3)
Chloride: 98 mmol/L (ref 98–111)
Creatinine, Ser: 1.49 mg/dL — ABNORMAL HIGH (ref 0.61–1.24)
GFR, Estimated: 48 mL/min — ABNORMAL LOW (ref >60)
Glucose, Bld: 442 mg/dL — ABNORMAL HIGH (ref 70–99)
Potassium: 3.6 mmol/L (ref 3.5–5.1)
Sodium: 136 mmol/L (ref 135–145)
Total Bilirubin: 0.3 mg/dL (ref 0.0–1.2)
Total Protein: 6.8 g/dL (ref 6.5–8.1)

## 2024-01-06 LAB — CBC
HCT: 39.7 % (ref 39.0–52.0)
Hemoglobin: 13.1 g/dL (ref 13.0–17.0)
MCH: 27.7 pg (ref 26.0–34.0)
MCHC: 33 g/dL (ref 30.0–36.0)
MCV: 83.9 fL (ref 80.0–100.0)
Platelets: 209 K/uL (ref 150–400)
RBC: 4.73 MIL/uL (ref 4.22–5.81)
RDW: 13.4 % (ref 11.5–15.5)
WBC: 6.5 K/uL (ref 4.0–10.5)
nRBC: 0 % (ref 0.0–0.2)

## 2024-01-06 LAB — PROTIME-INR
INR: 1 (ref 0.8–1.2)
Prothrombin Time: 13.4 s (ref 11.4–15.2)

## 2024-01-06 LAB — CK: Total CK: 159 U/L (ref 49–397)

## 2024-01-06 NOTE — ED Provider Notes (Incomplete)
  [place] EMERGENCY DEPARTMENT AT [facility] Provider Note   CSN: [id number] Arrival date & time: 01/06/24  1610     Patient presents with: Weakness and Fall   Paul Roberts is a 78 y.o. male.  {Add pertinent medical, surgical, social history, OB history to YEP:67052}  Weakness Fall       Prior to Admission medications   Medication Sig Start Date End Date Taking? Authorizing Provider  amLODipine (NORVASC) 5 MG tablet Take 5 mg by mouth daily. 03/09/21   [provider]  apixaban  (ELIQUIS ) 2.5 MG TABS tablet Take 1 tablet (2.5 mg total) by mouth 2 (two) times daily. 05/24/23   Dorsey, John T IV, MD  atorvastatin (LIPITOR) 20 MG tablet Take 20 mg by mouth daily. 04/01/21   [provider]  glimepiride (AMARYL) 1 MG tablet Take 1 mg by mouth daily with breakfast.    [provider]  Glycerin -Hypromellose-PEG 400 (DRY EYE RELIEF DROPS OP) Apply 1-2 drops to eye as needed (dryness).    [provider]  metFORMIN (GLUCOPHAGE-XR) 500 MG 24 hr tablet Take 500 mg by mouth 2 (two) times daily. 09/27/17   [provider]  Multiple Vitamin (MULTI-VITAMIN) tablet Take 1 tablet by mouth daily.    [provider]  NOVOLIN 70/30 KWIKPEN (70-30) 100 UNIT/ML KwikPen Inject 8 Units into the skin 2 (two) times daily. 03/12/21   [provider]  tamsulosin  (FLOMAX ) 0.4 MG CAPS capsule Take 0.8 mg by mouth daily. 03/17/21   [provider]  valsartan-hydrochlorothiazide (DIOVAN-HCT) 160-25 MG per tablet Take 1 tablet by mouth at bedtime.     [provider]    Allergies: Ciprofloxacin , Rosuvastatin, and Sulfa antibiotics    Review of Systems  Neurological:  Positive for weakness.    Updated Vital Signs BP 138/72   Pulse 82   Temp (!) 97.5 F (36.4 C) (Oral)   Resp (!) 24   Ht 5' 11 (1.803 m)   Wt 80 kg   SpO2 97%   BMI 24.60 kg/m   Physical Exam  (all labs ordered are listed, but only abnormal  results are displayed) Labs Reviewed  CBG MONITORING, ED - Abnormal; Notable for the following components:      Result Value   Glucose-Capillary 410 (*)    All other components within normal limits  CBC  PROTIME-INR  COMPREHENSIVE METABOLIC PANEL WITH GFR  CBG MONITORING, ED    EKG: None  Radiology: No results found.  {Document cardiac monitor, telemetry assessment procedure when appropriate:32947} Procedures   Medications Ordered in the ED - No data to display    {Click here for ABCD2, HEART and other calculators REFRESH Note before signing:1}                              Medical Decision Making Amount and/or Complexity of Data Reviewed Labs: ordered.   ***  {Document critical care time when appropriate  Document review of labs and clinical decision tools ie CHADS2VASC2, etc  Document your independent review of radiology images and any outside records  Document your discussion with family members, caretakers and with consultants  Document social determinants of health affecting pt's care  Document your decision making why or why not admission, treatments were needed:32947:::1}   Final diagnoses:  None    ED Discharge Orders     None

## 2024-01-06 NOTE — ED Provider Notes (Incomplete)
 [place] EMERGENCY DEPARTMENT AT [facility] Provider Note   CSN: [id number] Arrival date & time: 01/06/24  1610     Patient presents with: Weakness and Fall   Paul Roberts is a 78 y.o. male  {Add pertinent medical, surgical, social history, OB history to HPI:32947}  Weakness Fall       Prior to Admission medications   Medication Sig Start Date End Date Taking? Authorizing Provider  amLODipine (NORVASC) 5 MG tablet Take 5 mg by mouth daily. 03/09/21   [provider]  apixaban  (ELIQUIS ) 2.5 MG TABS tablet Take 1 tablet (2.5 mg total) by mouth 2 (two) times daily. 05/24/23   Dorsey, John T IV, MD  atorvastatin (LIPITOR) 20 MG tablet Take 20 mg by mouth daily. 04/01/21   [provider]  glimepiride (AMARYL) 1 MG tablet Take 1 mg by mouth daily with breakfast.    [provider]  Glycerin -Hypromellose-PEG 400 (DRY EYE RELIEF DROPS OP) Apply 1-2 drops to eye as needed (dryness).    [provider]  metFORMIN (GLUCOPHAGE-XR) 500 MG 24 hr tablet Take 500 mg by mouth 2 (two) times daily. 09/27/17   [provider]  Multiple Vitamin (MULTI-VITAMIN) tablet Take 1 tablet by mouth daily.    [provider]  NOVOLIN 70/30 KWIKPEN (70-30) 100 UNIT/ML KwikPen Inject 8 Units into the skin 2 (two) times daily. 03/12/21   [provider]  tamsulosin  (FLOMAX ) 0.4 MG CAPS capsule Take 0.8 mg by mouth daily. 03/17/21   [provider]  valsartan-hydrochlorothiazide (DIOVAN-HCT) 160-25 MG per tablet Take 1 tablet by mouth at bedtime.     [provider]    Allergies: Ciprofloxacin , Rosuvastatin, and Sulfa antibiotics    Review of Systems  Neurological:  Positive for weakness.    Updated Vital Signs BP 139/69   Pulse 78   Temp (!) 97.5 F (36.4 C) (Oral)   Resp (!) 22   Ht 5' 11 (1.803 m)   Wt 80 kg   SpO2 100%   BMI 24.60 kg/m   Physical Exam  (all labs ordered are listed, but only abnormal  results are displayed) Labs Reviewed  COMPREHENSIVE METABOLIC PANEL WITH GFR - Abnormal; Notable for the following components:      Result Value   Glucose, Bld 442 (*)    Creatinine, Ser 1.49 (*)    GFR, Estimated 48 (*)    All other components within normal limits  CBG MONITORING, ED - Abnormal; Notable for the following components:   Glucose-Capillary 410 (*)    All other components within normal limits  CBC  PROTIME-INR  URINALYSIS, ROUTINE W REFLEX MICROSCOPIC  CBG MONITORING, ED    EKG: None  Radiology: No results found.  {Document cardiac monitor, telemetry assessment procedure when appropriate:32947} Procedures   Medications Ordered in the ED - No data to display    {Click here for ABCD2, HEART and other calculators REFRESH Note before signing:1}                              Medical Decision Making Amount and/or Complexity of Data Reviewed Labs: ordered.   ***  {Document critical care time when appropriate  Document review of labs and clinical decision tools ie CHADS2VASC2, etc  Document your independent review of radiology images and any outside records  Document your discussion with family members, caretakers and with consultants  Document social determinants of health affecting pt's  care  Document your decision making why or why not admission, treatments were needed:32947:::1}   Final diagnoses:  None    ED Discharge Orders     None

## 2024-01-06 NOTE — ED Triage Notes (Signed)
 Pt arrives via EMS where pt was walking on a trail and fell. Denies hitting head or LOC. Endorses bilateral leg weakness. NSR for EMS with PACs and bigeminy. Denies any pain.

## 2024-01-06 NOTE — ED Provider Notes (Signed)
 [place] EMERGENCY DEPARTMENT AT [facility] Provider Note   CSN: [id number] Arrival date & time: 01/06/24  1610     Patient presents with: Weakness and Fall   Paul Roberts is a 78 y.o. male with a past medical history of diabetes, hypertension, hyperlipidemia, previous history of unprovoked pulmonary embolism who does not take his Eliquis  who presents to the emergency department with chief complaint of fall.  Patient reports that he walks daily but today he fell to the ground and was unable to get himself up off of the ground.  He states that some bystanders came over to help but he was too weak to stand.  He has never had anything like this before.  He did not hit his head.  He states he fell on his tailbone.  He does not have any pain.  He denies having any history of leg weakness.  He has a baseline tremor and has been evaluated in the past by Dr. Asberry Tat at [facility] neurology.  Review of EMR shows that in the past he has had MRI of the brain that showed significant ventriculomegaly about 1 year ago however he had no evidence of normal pressure hydrocephalus.  Patient denies urinary symptoms, back pain, fevers, chills, recent procedures.  He does not have unilateral weakness or other neurologic deficits.  {Add pertinent medical, surgical, social history, OB history to YEP:67052}  Weakness Fall       Prior to Admission medications   Medication Sig Start Date End Date Taking? Authorizing Provider  amLODipine (NORVASC) 5 MG tablet Take 5 mg by mouth daily. 03/09/21   [provider]  apixaban  (ELIQUIS ) 2.5 MG TABS tablet Take 1 tablet (2.5 mg total) by mouth 2 (two) times daily. 05/24/23   Dorsey, John T IV, MD  atorvastatin (LIPITOR) 20 MG tablet Take 20 mg by mouth daily. 04/01/21   [provider]  glimepiride (AMARYL) 1 MG tablet Take 1 mg by mouth daily with breakfast.    [provider]  Glycerin -Hypromellose-PEG 400 (DRY EYE RELIEF DROPS  OP) Apply 1-2 drops to eye as needed (dryness).    [provider]  metFORMIN (GLUCOPHAGE-XR) 500 MG 24 hr tablet Take 500 mg by mouth 2 (two) times daily. 09/27/17   [provider]  Multiple Vitamin (MULTI-VITAMIN) tablet Take 1 tablet by mouth daily.    [provider]  NOVOLIN 70/30 KWIKPEN (70-30) 100 UNIT/ML KwikPen Inject 8 Units into the skin 2 (two) times daily. 03/12/21   [provider]  tamsulosin  (FLOMAX ) 0.4 MG CAPS capsule Take 0.8 mg by mouth daily. 03/17/21   [provider]  valsartan-hydrochlorothiazide (DIOVAN-HCT) 160-25 MG per tablet Take 1 tablet by mouth at bedtime.     [provider]    Allergies: Ciprofloxacin , Rosuvastatin, and Sulfa antibiotics    Review of Systems  Neurological:  Positive for weakness.    Updated Vital Signs BP 136/86   Pulse 86   Temp 97.9 F (36.6 C)   Resp 18   Ht 5' 11 (1.803 m)   Wt 80 kg   SpO2 99%   BMI 24.60 kg/m   Physical Exam  (all labs ordered are listed, but only abnormal results are displayed) Labs Reviewed  COMPREHENSIVE METABOLIC PANEL WITH GFR - Abnormal; Notable for the following components:      Result Value   Glucose, Bld 442 (*)    Creatinine, Ser 1.49 (*)    GFR, Estimated 48 (*)  All other components within normal limits  URINALYSIS, ROUTINE W REFLEX MICROSCOPIC - Abnormal; Notable for the following components:   Glucose, UA >=500 (*)    All other components within normal limits  CBG MONITORING, ED - Abnormal; Notable for the following components:   Glucose-Capillary 410 (*)    All other components within normal limits  CBC  PROTIME-INR  CK  CBG MONITORING, ED    EKG: EKG Interpretation Date/Time:  Monday January 06 2024 16:20:48 EDT Ventricular Rate:  87 PR Interval:  158 QRS Duration:  159 QT Interval:  409 QTC Calculation: 493 R Axis:   45  Text Interpretation: Sinus rhythm Supraventricular bigeminy Right bundle branch block RBBB  old Confirmed by Bari Flank [phone] on 01/06/2024 6:42:13 PM  Radiology: CT Head Wo Contrast Result Date: 01/06/2024 EXAM: CT HEAD WITHOUT CONTRAST 01/06/2024 07:36:32 PM TECHNIQUE: CT of the head was performed without the administration of intravenous contrast. Automated exposure control, iterative reconstruction, and/or weight based adjustment of the mA/kV was utilized to reduce the radiation dose to as low as reasonably achievable. COMPARISON: 01/24/2017 CLINICAL HISTORY: Neuro deficit, acute, stroke suspected. Pt arrives via EMS where pt was walking on a trail and fell. Denies hitting head or LOC. Endorses bilateral leg weakness. NSR for EMS with PACs and bigeminy. Denies any pain. FINDINGS: BRAIN AND VENTRICLES: No acute hemorrhage. No evidence of acute infarct. No hydrocephalus. No extra-axial collection. No mass effect or midline shift. Unchanged volume loss. ORBITS: No acute abnormality. SINUSES: No acute abnormality. SOFT TISSUES AND SKULL: No acute soft tissue abnormality. No skull fracture. IMPRESSION: 1. No acute intracranial abnormality. 2. Unchanged volume loss compared to 01/24/17. Electronically signed by: Franky Stanford MD 01/06/2024 08:11 PM EDT RP Workstation: [id number]    {Document cardiac monitor, telemetry assessment procedure when appropriate:32947} Procedures   Medications Ordered in the ED - No data to display    {Click here for ABCD2, HEART and other calculators REFRESH Note before signing:1}                              Medical Decision Making Amount and/or Complexity of Data Reviewed Labs: ordered. Radiology: ordered.   ***  {Document critical care time when appropriate  Document review of labs and clinical decision tools ie CHADS2VASC2, etc  Document your independent review of radiology images and any outside records  Document your discussion with family members, caretakers and with consultants  Document social determinants of health affecting pt's care   Document your decision making why or why not admission, treatments were needed:32947:::1}   Final diagnoses:  None    ED Discharge Orders     None

## 2024-01-06 NOTE — Discharge Instructions (Signed)
 ### NPH and Anticoagulation Handout     **English**      ---      **Why you came to the hospital:**      You came to the emergency room after a fall and because you were having trouble walking. A CT scan of your brain showed that the spaces in your brain that hold fluid (called ventricles) are larger than normal. This can be a sign of a condition called normal pressure hydrocephalus (NPH).      **What is Normal Pressure Hydrocephalus (NPH)?**      NPH is a condition that usually affects older adults. It happens when extra fluid builds up in the brain, causing the ventricles to get bigger. This can lead to problems with walking, memory, and sometimes bladder control. Not everyone has all these symptoms.[1][2][3][4]      **What happens next?**      - You will need to see a neurosurgeon (a brain doctor) as an outpatient.        - The neurosurgeon may recommend a test called a lumbar puncture (spinal tap), where a small amount of fluid is removed from your lower back. This helps to confirm the diagnosis and see if you might benefit from treatment.[1][5][6]      - Sometimes, a larger amount of fluid is removed to see if your walking improves. This is called a large volume lumbar puncture or tap test. Removing up to 30 mL of fluid is generally safe and well tolerated.[5][7]      - If the test shows improvement, you may be a candidate for a procedure to drain the extra fluid (a shunt).[1][8][9][6]      **Important information about your blood thinner (Eliquis  / apixaban ):**      - You have a history of a blood clot in your lungs (pulmonary embolism) that was not caused by a clear reason.        - Medical guidelines recommend that people with an unprovoked pulmonary embolism usually need to take a blood thinner like Eliquis  (apixaban ) for life, unless there is a high risk of bleeding.[10][11]      - You are currently not taking Eliquis . It is important to contact your primary care doctor  as soon as possible to discuss restarting this medication, unless you were told to stop for a specific reason.      **Special instructions for your upcoming lumbar puncture:**      - Blood thinners like Eliquis  can increase the risk of bleeding with procedures like lumbar puncture.        - Your neurosurgeon and primary care doctor will work together to decide when to safely stop Eliquis  before the procedure and when to restart it after.[12][13][14][15]      - Do not stop or restart Eliquis  on your own. Always follow your doctors' instructions.      **What to watch for:**      - If you have new or worsening headaches, confusion, trouble walking, weakness, or any new symptoms, seek medical attention right away.      - If you have signs of bleeding (such as unusual bruising, blood in urine or stool, or severe headache), contact your doctor immediately.      **Summary:**      - You have possible NPH and need further testing.      - You have a history of a blood clot and should be on Eliquis  unless your doctor says otherwise.      -  Contact your primary care doctor about your blood thinner.      - Follow up with neurosurgery for further evaluation and treatment.      ---      **Amharic (????)**      ---      **?? ????? ??? ??:**      ????? ??? ?? ????? ??? ??????? ?? ????? ??? ??? ????? ?? ??? ????? ??? ???? ???? (??????) ?????? ???? ??? ?????? ???? ?? ????? ???? ???????? (NPH) ???? ??? ???? ??? ?????      **???? ???? ???????? (NPH) ??????**      NPH ????? ???? ??? ????? ??? ?????? ??? ??? ????? ??? ???? ??? ????? ?????? ?????? ?? ????? ???? ????? ???? ?? ????? ?? ???? ????? ??? ???? ????? ??? ??? ????? ????? ?? ??????[1][2][3][4]      **?????? ??????**      - ????? ??? (???????) ?? ??? ??? ????? ???? ?????        - ??????? ???? ???? ????? (???? ??? ????) ??? ?????? ????? ?? ??????? ???? ????? ?? ???? ???? ?????? ???? ?????[1][5][6]      - ????? ?? ???? ??? ??? ????? ??  ???? ??? ????? ??????? ??      ### References  1. Practice Guideline: Idiopathic Normal Pressure Hydrocephalus: Response to Shunting and Predictors of Response: Report of the Guideline Development, Dissemination, and Implementation Subcommittee of the American Academy of Neurology. Bryce DUKES, Kurlan R, Schwalb JM, et al. Neurology. 2015;85(23):2063-71. doi:10.1212/WNL.9999999999997806. 2. Normal Pressure Hydrocephalus: Neurophysiological and Neuropsychological Aspects: A Narrative Review. Micchia K, Formica C, De Salvo S, et al. Medicine. 2022;101(9):e28922. doi:10.1097/MD.0000000000028922. 3. Normal Pressure Hydrocephalus-an Overview of Pathophysiological Mechanisms and Diagnostic Procedures. Skalick P, Mldek A, Vlask A, et al. Neurosurgical Review. 2020;43(6):1451-1464. doi:10.1007/s10143-019-01201-5. 4. Diagnostic Importance of Disproportionately Enlarged Subarachnoid Space Hydrocephalus and Callosal Angle Measurements in the Context of Normal Pressure Hydrocephalus: A Useful Tool for Clinicians. Teresa MATSU, Ortega-Matute E, Perez-Nieto D, Inzunza A, Garcia Navarro V. Journal of the Neurological Sciences. [phone]. doi:10.1016/j.jns.2024.123250. 5. Lumbar Puncture Test in Normal Pressure Hydrocephalus: Does the Volume of CSF Removed Affect the Response to Tap?SABRA Berniece FREED, Gevena GRADE, Miskin NP, et al. MITCHEAL. American Journal of Neuroradiology. 2017;38(7):1456-1460. doi:10.3174/ajnr.J4812. 6. Temporary Drainage of Cerebrospinal Fluid for Diagnosis and Treatment of Hydrocephalus. Celestia SAHA, Jennine PARAS. [facility] [place]. 2025;36(2):233-246. doi:10.1016/j.nec.2024.12.002. 7. Factors Associated With the Onset and Persistence of Post-Lumbar Puncture Headache. Monserrate AE, Ryman DC, Ma S, et al. JAMA Neurology. 2015;72(3):325-32. doi:10.1001/jamaneurol.7985.6025. 8. Invasive Preoperative Investigations in Idiopathic Normal Pressure Hydrocephalus: A Comprehensive Review. Grasso  G, Teresi G, Noto M, Torregrossa F. World Neurosurgery. 2024;181:178-183. doi:10.1016/j.wneu.2023.10.141. 9. Lumbar Drain Trial Outcomes of Normal Pressure Hydrocephalus: A Single-Center Experience of 254 Patients. [place], Alena EM, Katherine NOVAK, et al. Journal of Neurosurgery. 2020;132(1):306-312. doi:10.3171/2018.1.[id number]. 10. FDA Orange Book. FDA Orange Book. 11. Pulmonary Embolism. Kahn SR, de Wit K. The [place] Journal of Medicine. 2022;387(1):45-57. doi:10.1056/NEJMcp2116489. 12. Evaluation and Treatment of Patients With Suspected Normal Pressure Hydrocephalus on Long-Term Warfarin Anticoagulation Therapy. Jolaine SEARING, Narvis GORMAN Cesario SHAUNNA, et al. Neurosurgery. 2007;60(3):497-501; discussion 502. doi:10.1227/01.NEU.443 767 8961.71700.E1. 13. Association of Lumbar Puncture With Spinal Hematoma in Patients With and Without Coagulopathy. Bodilsen J, Mariager T, Vestergaard HH, et al. JAMA. 2020;324(14):1419-1428. doi:10.1001/jama.7979.85104. 14. Ventriculoperitoneal Shunt Placement Safety in Idiopathic Normal Pressure Hydrocephalus: Anticoagulated Versus Non-Anticoagulated Patients. Hamouda AM, Lamon JENEANE Ku M, et al. World Neurosurgery. 2024;186:e622-e629. doi:10.1016/j.wneu.2024.04.018. 15. Use of Low-Dose Prothrombin Complex Concentrate Before Lumbar Puncture. Towana DUKES. American Journal of Health-System  Pharmacy : AJHP : Official Journal of the American Society of [employer]. 2015;72(3):203-5. doi:10.2146/ajhp140310.

## 2024-01-06 NOTE — ED Notes (Signed)
 Pt given sandwich and water per EDP

## 2024-01-06 NOTE — ED Notes (Signed)
 Pt calling brother for a ride home.

## 2024-04-17 ENCOUNTER — Encounter (INDEPENDENT_AMBULATORY_CARE_PROVIDER_SITE_OTHER): Payer: Medicare Other | Admitting: Ophthalmology

## 2024-05-11 ENCOUNTER — Emergency Department (HOSPITAL_COMMUNITY)
Admission: EM | Admit: 2024-05-11 | Discharge: 2024-05-11 | Disposition: A | Discharge status: Home / Self Care | Attending: Emergency Medicine | Admitting: Emergency Medicine

## 2024-05-11 ENCOUNTER — Other Ambulatory Visit: Payer: Self-pay

## 2024-05-11 DIAGNOSIS — I10 Essential (primary) hypertension: Secondary | ICD-10-CM | POA: Insufficient documentation

## 2024-05-11 DIAGNOSIS — E119 Type 2 diabetes mellitus without complications: Secondary | ICD-10-CM | POA: Diagnosis not present

## 2024-05-11 DIAGNOSIS — R040 Epistaxis: Secondary | ICD-10-CM | POA: Insufficient documentation

## 2024-05-11 MED ORDER — OXYMETAZOLINE HCL 0.05 % NA SOLN: 1.0000 | Freq: Two times a day (BID) | NASAL | 0 refills | Status: AC

## 2024-05-11 NOTE — Discharge Instructions (Addendum)
 You were seen today for right-sided epistaxis.  Your physical exam today was very reassuring, recommend you continue to add Aquaphor to nose to help decrease the dryness in your nose.  Additionally recommending that you continue to drink plenty of fluids to stay well-hydrated.  If you begin to have any recurrence in nosebleeding, recommend you continue to hold for 15 minutes below the nose bridge, leaning forward.  If this does not work, recommend you can take the Afrin after blowing out all the clots in your nose.  Additionally hold for another 15 minutes leaning forward.  If this does not work or if you begin to experience lightheadedness, chest pain, shortness of breath, please return to the ED for further evaluation.

## 2024-05-11 NOTE — ED Provider Notes (Signed)
 " [place] EMERGENCY DEPARTMENT AT [facility] Provider Note   CSN: [id number] Arrival date & time: 05/11/24  1521     Patient presents with: No chief complaint on file.   Paul Roberts is a 79 y.o. male. HPI 79 year old male presenting today with concerns for right side epistaxes that has been intermittent x 2 days.  Reported that last episode was earlier this morning.  Has not had recurrence in approximately 3 hours.  Personal history of diabetes, HTN.  Noted to have been previously on Eliquis  but reports that he has not been taking this.  Denies headache, vision changes, vertigo, lightheadedness, chest pain, shortness breath, abdominal pain, nausea, vomiting.    Prior to Admission medications  Medication Sig Start Date End Date Taking? Authorizing Provider  oxymetazoline  (AFRIN NASAL SPRAY) 0.05 % nasal spray Place 1 spray into both nostrils 2 (two) times daily. 05/11/24  Yes Beola Terrall RAMAN, PA-C  amLODipine (NORVASC) 5 MG tablet Take 5 mg by mouth daily. 03/09/21   [provider]  apixaban  (ELIQUIS ) 2.5 MG TABS tablet Take 1 tablet (2.5 mg total) by mouth 2 (two) times daily. 05/24/23   Dorsey, John T IV, MD  atorvastatin (LIPITOR) 20 MG tablet Take 20 mg by mouth daily. 04/01/21   [provider]  glimepiride (AMARYL) 1 MG tablet Take 1 mg by mouth daily with breakfast.    [provider]  Glycerin -Hypromellose-PEG 400 (DRY EYE RELIEF DROPS OP) Apply 1-2 drops to eye as needed (dryness).    [provider]  metFORMIN (GLUCOPHAGE-XR) 500 MG 24 hr tablet Take 500 mg by mouth 2 (two) times daily. 09/27/17   [provider]  Multiple Vitamin (MULTI-VITAMIN) tablet Take 1 tablet by mouth daily.    [provider]  NOVOLIN 70/30 KWIKPEN (70-30) 100 UNIT/ML KwikPen Inject 8 Units into the skin 2 (two) times daily. 03/12/21   [provider]  tamsulosin  (FLOMAX ) 0.4 MG CAPS capsule Take 0.8 mg by mouth daily. 03/17/21    [provider]  valsartan-hydrochlorothiazide (DIOVAN-HCT) 160-25 MG per tablet Take 1 tablet by mouth at bedtime.     [provider]    Allergies: Ciprofloxacin , Rosuvastatin, and Sulfa antibiotics    Review of Systems  HENT:  Positive for nosebleeds.   All other systems reviewed and are negative.   Updated Vital Signs BP (!) 139/56   Pulse 61   Temp 98.6 F (37 C)   Resp 16   SpO2 97%   Physical Exam Vitals and nursing note reviewed.  Constitutional:      General: He is not in acute distress.    Appearance: Normal appearance. He is not ill-appearing or diaphoretic.  HENT:     Head: Normocephalic and atraumatic.     Nose: No congestion.     Comments: Notably has dried blood in anterior right naris.  No blood in the oropharynx.  No active bleeding present this time.  Nares bilaterally appear to be dry.    Mouth/Throat:     Mouth: Mucous membranes are moist.     Pharynx: Oropharynx is clear. No oropharyngeal exudate or posterior oropharyngeal erythema.  Eyes:     General: No scleral icterus.       Right eye: No discharge.        Left eye: No discharge.     Extraocular Movements: Extraocular movements intact.     Conjunctiva/sclera: Conjunctivae normal.     Pupils: Pupils are equal, round, and reactive to  light.  Cardiovascular:     Rate and Rhythm: Normal rate and regular rhythm.     Pulses: Normal pulses.     Heart sounds: Normal heart sounds. No murmur heard.    No friction rub. No gallop.  Pulmonary:     Effort: Pulmonary effort is normal. No respiratory distress.     Breath sounds: No stridor. No wheezing, rhonchi or rales.  Chest:     Chest wall: No tenderness.  Abdominal:     General: Abdomen is flat. There is no distension.     Palpations: Abdomen is soft.     Tenderness: There is no abdominal tenderness. There is no right CVA tenderness, left CVA tenderness, guarding or rebound.  Musculoskeletal:        General: No swelling, deformity or  signs of injury.     Cervical back: Normal range of motion. No rigidity.     Right lower leg: No edema.     Left lower leg: No edema.  Skin:    General: Skin is warm and dry.     Findings: No bruising, erythema or lesion.  Neurological:     General: No focal deficit present.     Mental Status: He is alert and oriented to person, place, and time. Mental status is at baseline.     Sensory: No sensory deficit.     Motor: No weakness.  Psychiatric:        Mood and Affect: Mood normal.     (all labs ordered are listed, but only abnormal results are displayed) Labs Reviewed - No data to display  EKG: None  Radiology: No results found. Procedures   Medications Ordered in the ED - No data to display   Medical Decision Making  This patient is a 79 year old male who presents to the ED for concern of right side epistaxis that has been intermittent x 2 days, currently has been observed to not have any active bleeding x 3 hours.   On physical exam, patient is in no acute distress, afebrile, alert and orient x 4, speaking in full sentences, nontachypneic, nontachycardic.  Notably does have dried blood present in right nares, with no active bleeding present.  Noted to be anterior clotting with no posterior blood noted.  No blood noted in the oropharynx.  Unremarkable exam otherwise.  Patient having no active bleeding, not on Eliquis , and is having no signs of symptomatic anemia, low suspicion for any emergent causes of patient symptoms today.  Will have continued follow-up with PCP for any persistent issues as he may need ENT referral if persistent.  Has had times of Afrin to use in the interim if patient has recurrent bleeding.  Patient vital signs have remained stable throughout the course of patient's time in the ED. Low suspicion for any other emergent pathology at this time. I believe this patient is safe to be discharged. Provided strict return to ER precautions. Patient expressed  agreement and understanding of plan. All questions were answered.  Differential diagnoses prior to evaluation: The emergent differential diagnosis includes, but is not limited to, anterior versus posterior epistaxis, symptomatic anemia, trauma. This is not an exhaustive differential.   Past Medical History / Co-morbidities / Social History: Diabetes, HTN  Additional history: Chart reviewed. Pertinent results include:   01/06/2024 was seen in the Emergency Department for fall.    Medications: I ordered medication including Afrin.  I have reviewed the patients home medicines and have made adjustments as needed.  Critical Interventions:  None  Social Determinants of Health: Has good follow-up PCP  Disposition: After consideration of the diagnostic results and the patients response to treatment, I feel that the patient would benefit from discharge and treat as above.   emergency department workup does not suggest an emergent condition requiring admission or immediate intervention beyond what has been performed at this time. The plan is: Follow-up with PCP, send any management home, return to the ER for new or worsening symptoms. The patient is safe for discharge and has been instructed to return immediately for worsening symptoms, change in symptoms or any other concerns.   Final diagnoses:  Right-sided epistaxis    ED Discharge Orders          Ordered    oxymetazoline  [facility] NASAL SPRAY) 0.05 % nasal spray  2 times daily        05/11/24 1801               Beola Terrall RAMAN, PA-C 05/11/24 1807    Franklyn Sid SAILOR, MD 05/11/24 1813  "

## 2024-05-11 NOTE — ED Triage Notes (Addendum)
 Pt reports he has been having intermittent nose bleeding for 2 days. No bleeding noted upon assessment
